# Patient Record
Sex: Female | Born: 1975
Health system: Southern US, Community
[De-identification: ages and names within clinical notes are randomized; demographics above are authoritative.]

## PROBLEM LIST (undated history)

## (undated) ENCOUNTER — Inpatient Hospital Stay (HOSPITAL_COMMUNITY): Payer: Self-pay

## (undated) ENCOUNTER — Emergency Department (HOSPITAL_COMMUNITY): Admission: EM | Payer: Medicaid Other | Source: Home / Self Care

## (undated) DIAGNOSIS — F32A Depression, unspecified: Secondary | ICD-10-CM

## (undated) DIAGNOSIS — R87619 Unspecified abnormal cytological findings in specimens from cervix uteri: Secondary | ICD-10-CM

## (undated) DIAGNOSIS — E785 Hyperlipidemia, unspecified: Secondary | ICD-10-CM

## (undated) DIAGNOSIS — O09529 Supervision of elderly multigravida, unspecified trimester: Secondary | ICD-10-CM

## (undated) DIAGNOSIS — Z8719 Personal history of other diseases of the digestive system: Secondary | ICD-10-CM

## (undated) DIAGNOSIS — N390 Urinary tract infection, site not specified: Secondary | ICD-10-CM

## (undated) DIAGNOSIS — T4145XA Adverse effect of unspecified anesthetic, initial encounter: Secondary | ICD-10-CM

## (undated) DIAGNOSIS — R51 Headache: Secondary | ICD-10-CM

## (undated) DIAGNOSIS — F329 Major depressive disorder, single episode, unspecified: Secondary | ICD-10-CM

## (undated) DIAGNOSIS — T8859XA Other complications of anesthesia, initial encounter: Secondary | ICD-10-CM

## (undated) DIAGNOSIS — F419 Anxiety disorder, unspecified: Secondary | ICD-10-CM

## (undated) DIAGNOSIS — IMO0002 Reserved for concepts with insufficient information to code with codable children: Secondary | ICD-10-CM

## (undated) HISTORY — DX: Headache: R51

## (undated) HISTORY — PX: TONSILLECTOMY: SUR1361

## (undated) HISTORY — DX: Hyperlipidemia, unspecified: E78.5

## (undated) HISTORY — PX: AUGMENTATION MAMMAPLASTY: SUR837

## (undated) HISTORY — PX: DILATION AND CURETTAGE OF UTERUS: SHX78

## (undated) HISTORY — DX: Personal history of other diseases of the digestive system: Z87.19

## (undated) HISTORY — PX: EYE SURGERY: SHX253

## (undated) HISTORY — DX: Supervision of elderly multigravida, unspecified trimester: O09.529

---

## 1998-03-28 ENCOUNTER — Encounter: Payer: Self-pay | Admitting: Emergency Medicine

## 1998-03-28 ENCOUNTER — Emergency Department (HOSPITAL_COMMUNITY): Admission: EM | Admit: 1998-03-28 | Discharge: 1998-03-28 | Payer: Self-pay | Admitting: Emergency Medicine

## 1998-11-07 ENCOUNTER — Emergency Department (HOSPITAL_COMMUNITY): Admission: EM | Admit: 1998-11-07 | Discharge: 1998-11-08 | Payer: Self-pay | Admitting: Emergency Medicine

## 2000-03-09 ENCOUNTER — Other Ambulatory Visit: Admission: RE | Admit: 2000-03-09 | Discharge: 2000-03-09 | Payer: Self-pay | Admitting: Obstetrics and Gynecology

## 2000-05-16 ENCOUNTER — Emergency Department (HOSPITAL_COMMUNITY): Admission: EM | Admit: 2000-05-16 | Discharge: 2000-05-16 | Payer: Self-pay | Admitting: Emergency Medicine

## 2001-03-16 ENCOUNTER — Other Ambulatory Visit: Admission: RE | Admit: 2001-03-16 | Discharge: 2001-03-16 | Payer: Self-pay | Admitting: Obstetrics and Gynecology

## 2001-09-20 ENCOUNTER — Encounter: Payer: Self-pay | Admitting: Surgery

## 2001-09-20 ENCOUNTER — Emergency Department (HOSPITAL_COMMUNITY): Admission: EM | Admit: 2001-09-20 | Discharge: 2001-09-20 | Payer: Self-pay | Admitting: Emergency Medicine

## 2001-10-19 ENCOUNTER — Ambulatory Visit (HOSPITAL_COMMUNITY): Admission: RE | Admit: 2001-10-19 | Discharge: 2001-10-19 | Payer: Self-pay | Admitting: Obstetrics and Gynecology

## 2001-10-19 ENCOUNTER — Encounter: Payer: Self-pay | Admitting: Obstetrics and Gynecology

## 2001-11-19 ENCOUNTER — Encounter: Payer: Self-pay | Admitting: Obstetrics and Gynecology

## 2001-11-19 ENCOUNTER — Ambulatory Visit (HOSPITAL_COMMUNITY): Admission: RE | Admit: 2001-11-19 | Discharge: 2001-11-19 | Payer: Self-pay | Admitting: Family Medicine

## 2001-11-27 ENCOUNTER — Encounter: Payer: Self-pay | Admitting: *Deleted

## 2001-11-27 ENCOUNTER — Ambulatory Visit (HOSPITAL_COMMUNITY): Admission: RE | Admit: 2001-11-27 | Discharge: 2001-11-27 | Payer: Self-pay | Admitting: *Deleted

## 2002-01-01 ENCOUNTER — Ambulatory Visit (HOSPITAL_COMMUNITY): Admission: RE | Admit: 2002-01-01 | Discharge: 2002-01-01 | Payer: Self-pay | Admitting: *Deleted

## 2002-01-01 ENCOUNTER — Encounter: Payer: Self-pay | Admitting: *Deleted

## 2002-01-15 ENCOUNTER — Inpatient Hospital Stay (HOSPITAL_COMMUNITY): Admission: AD | Admit: 2002-01-15 | Discharge: 2002-01-15 | Payer: Self-pay | Admitting: Obstetrics and Gynecology

## 2002-02-07 ENCOUNTER — Inpatient Hospital Stay (HOSPITAL_COMMUNITY): Admission: AD | Admit: 2002-02-07 | Discharge: 2002-02-07 | Payer: Self-pay | Admitting: Obstetrics and Gynecology

## 2002-02-12 ENCOUNTER — Encounter: Admission: AD | Admit: 2002-02-12 | Discharge: 2002-02-28 | Payer: Self-pay | Admitting: Obstetrics and Gynecology

## 2002-02-19 ENCOUNTER — Inpatient Hospital Stay (HOSPITAL_COMMUNITY): Admission: AD | Admit: 2002-02-19 | Discharge: 2002-02-19 | Payer: Self-pay | Admitting: Obstetrics and Gynecology

## 2002-02-23 ENCOUNTER — Inpatient Hospital Stay (HOSPITAL_COMMUNITY): Admission: AD | Admit: 2002-02-23 | Discharge: 2002-02-23 | Payer: Self-pay | Admitting: Obstetrics and Gynecology

## 2002-03-05 ENCOUNTER — Inpatient Hospital Stay (HOSPITAL_COMMUNITY): Admission: AD | Admit: 2002-03-05 | Discharge: 2002-03-07 | Payer: Self-pay | Admitting: Obstetrics and Gynecology

## 2002-03-08 ENCOUNTER — Encounter: Admission: RE | Admit: 2002-03-08 | Discharge: 2002-04-07 | Payer: Self-pay | Admitting: Obstetrics and Gynecology

## 2002-04-17 ENCOUNTER — Other Ambulatory Visit: Admission: RE | Admit: 2002-04-17 | Discharge: 2002-04-17 | Payer: Self-pay | Admitting: Obstetrics and Gynecology

## 2002-11-22 ENCOUNTER — Ambulatory Visit (HOSPITAL_BASED_OUTPATIENT_CLINIC_OR_DEPARTMENT_OTHER): Admission: RE | Admit: 2002-11-22 | Discharge: 2002-11-22 | Payer: Self-pay | Admitting: Otolaryngology

## 2002-11-22 ENCOUNTER — Encounter (INDEPENDENT_AMBULATORY_CARE_PROVIDER_SITE_OTHER): Payer: Self-pay | Admitting: *Deleted

## 2003-02-27 ENCOUNTER — Ambulatory Visit (HOSPITAL_BASED_OUTPATIENT_CLINIC_OR_DEPARTMENT_OTHER): Admission: RE | Admit: 2003-02-27 | Discharge: 2003-02-27 | Payer: Self-pay | Admitting: Otolaryngology

## 2003-02-27 ENCOUNTER — Encounter (INDEPENDENT_AMBULATORY_CARE_PROVIDER_SITE_OTHER): Payer: Self-pay | Admitting: Specialist

## 2003-05-27 ENCOUNTER — Other Ambulatory Visit: Admission: RE | Admit: 2003-05-27 | Discharge: 2003-05-27 | Payer: Self-pay | Admitting: Obstetrics and Gynecology

## 2004-08-05 ENCOUNTER — Other Ambulatory Visit: Admission: RE | Admit: 2004-08-05 | Discharge: 2004-08-05 | Payer: Self-pay | Admitting: Obstetrics and Gynecology

## 2005-09-06 ENCOUNTER — Other Ambulatory Visit: Admission: RE | Admit: 2005-09-06 | Discharge: 2005-09-06 | Payer: Self-pay | Admitting: Obstetrics and Gynecology

## 2010-11-26 NOTE — H&P (Signed)
[facility]  Patient:    Kelly Clay, Kelly Clay Visit Number: [id number] MRN: [id number]          Service Type: EMS Location: ED Attending Physician:  Donnetta Hutching Dictated by:   Currie Paris, M.D. Admit Date:  09/20/2001                           History and Physical  VISIT #[id number]  CHIEF COMPLAINT:  Abdominal pain.  HISTORY OF PRESENT ILLNESS:  Ms. Millis is a 35 year old woman who is [redacted] weeks pregnant who has developed right lower quadrant pain.  She has actually had some diarrhea for several days and then last evening developed some more significant abdominal pain which was not localized right to left but today has now localized to the right lower quadrant.  She is not nauseated but is not hungry.  She points to the area just above [place] as the area where she is hurting the most.  She never had any pain similar to this.  She has had no problems with her pregnancy.  It has gone smoothly.  This is her second pregnancy.  She has had no urinary tract symptoms and no other general illnesses.  PAST MEDICAL HISTORY:  No operations.  MEDICATIONS:  Prenatal vitamins.  ALLERGIES:  None.  SOCIAL HISTORY:  Smokes, none.  Alcohol, none.  FAMILY HISTORY:  Unremarkable.  PHYSICAL EXAMINATION:  GENERAL:  Slightly overweight, healthy-appearing female who appears uncomplicated.  HEENT:  Head is normocephalic.  Eyes nonicteric.  Pupils round and regular.  NECK:  Supple.  No masses or thyromegaly.  LUNGS:  Clear to auscultation.  HEART:  Regular.  No murmurs, rubs, or gallops.  ABDOMEN:  Generally soft, but she has tenderness in the right lower quadrant. This is fairly marked with some referred rebound to the right lower quadrant. Bowel sounds are hypoactive.  I do not feel any masses.  EXTREMITIES:  No cyanosis or edema.  LABORATORY DATA:  Pending.  IMPRESSION:  Clinically likely to have appendicitis.  PLAN:  While awaiting the  laboratories, I think we will try to get an ultrasound and see if that helps [place] differentiate her diagnosis.  Once that is done, can make further decisions about surgical plans, admission, etc. Dictated by:   Currie Paris, M.D. Attending Physician:  Donnetta Hutching DD:  09/20/01 TD:  09/21/01 Job: [id number]

## 2010-11-26 NOTE — Op Note (Signed)
NAME:  Kelly Clay, Kelly Clay                           ACCOUNT NO.:  [id number]   MEDICAL RECORD NO.:  [id number]                   PATIENT TYPE:  AMB   LOCATION:  DSC                                  FACILITY:  [facility]   PHYSICIAN:  Hermelinda Medicus, M.D.                DATE OF BIRTH:  1976/05/03   DATE OF PROCEDURE:  11/22/2002  DATE OF DISCHARGE:                                 OPERATIVE REPORT   PREOPERATIVE DIAGNOSIS:  Septal deviation.  History of sinusitis.  History  of nasal obstruction.  History of poor response to decongestants, nasal  sprays, and persistent use of antibiotics.   POSTOPERATIVE DIAGNOSIS:  Septal deviation.  History of sinusitis.  History  of nasal obstruction.  History of poor response to decongestants, nasal  sprays, and persistent use of antibiotics.   OPERATION:  Septal reconstruction and turbinate reduction.   SURGEON:  Hermelinda Medicus, M.D.   ANESTHESIA:  General endotracheal with local supplement 1% Xylocaine with  epinephrine and topical cocaine 200 mg.   ANESTHESIOLOGIST:  Janetta Hora. Gelene Mink, M.D.   PROCEDURE:  The patient was placed in the supine position under general  endotracheal anesthesia.  The patient was prepped and draped __________ and  Hibiclens times three in the usual head drape.  Xylocaine 1% with  epinephrine and topical cocaine 200 mg was used for further anesthesia and  hemostasis after the patient was completely draped.  Once this was  completed, then the inferior turbinates were out-fractured in an effort to  gain more space in this very small, narrow nose.  The middle turbinate on  the right was somewhat large and this was decreased in size.  It was  pressing right up against the right ethmoid sinus.  The left side was  impossible to see because of the septal deviation, pushing over against the  turbinate as well seen on the CT scan.  A hemitransfixion incision was made  on the left side, carried back along the quadrilateral  cartilage posteriorly  and a strip of quadrilateral cartilage was taken from the posterior aspect.  The floor of the nose was also approached where the spur was vomerine and  was off the premaxillary crest and a strip was taken from this portion more  posteriorly.  The open-and-close Jansen-Middletons and Takahashi forceps  were used to remove an area of ethmoid septal deviation and also a portion  of vomerine septal deviation was used with the assistance of the 4-mm  chisel.  Once the spur and vomerine septal spur were removed, the septum was  established in the midline and the nasal airway was markedly improved.  The  middle turbinates were then brought medial so that the ethmoid sinuses could  drain more effectively.  The closure was with 5-0 plain catgut and a through-and-through septal  suture was used with 4-0 Prolene times two.  Intranasal dressing was placed  which  will be removed this afternoon and then followup will be in one week,  three weeks, and six weeks.                                               Hermelinda Medicus, M.D.    JC/MEDQ  D:  11/22/2002  T:  11/23/2002  Job:  [id number]   cc:   Ike Bene, M.D.  [address]  [place]  [place] [address]  Fax: [phone]

## 2010-11-26 NOTE — Discharge Summary (Signed)
NAME:  Kelly Clay, Kelly Clay                           ACCOUNT NO.:  [id number]   MEDICAL RECORD NO.:  [id number]                   PATIENT TYPE:  INP   LOCATION:  9117                                 FACILITY:  [facility]   PHYSICIAN:  Huel Cote, M.D.              DATE OF BIRTH:  03-16-1976   DATE OF ADMISSION:  03/05/2002  DATE OF DISCHARGE:  03/07/2002                                 DISCHARGE SUMMARY   DISCHARGE DIAGNOSES:  1. Term pregnancy at 2 and five-sevenths weeks delivered.  2. Preeclampsia.  3. Status post normal spontaneous vaginal delivery.   DISCHARGE MEDICATIONS:  1. Motrin 600 mg p.o. q.6h.  2. Percocet one to two tablets p.o. q.4h. p.r.n.   DISCHARGE FOLLOW-UP:  The patient is to follow up in our office in  approximately six weeks for her postpartum exam.   HISTORY OF PRESENT ILLNESS:  The patient is a 35 year old G3 P1-0-1-1 who  was admitted at 34 and five-sevenths weeks of weeks for induction of labor given  persistent mild preeclampsia.  The patient's blood pressure had been noted  to be elevated since approximately 33 weeks, running in the 150s to 90s  range and she had been being followed with NSTs and preeclamptic labs.  The  patient's blood pressure on the day of admission had increased to 150/110  and she had a new development of 1-2+ proteinuria.  Otherwise, the patient  had no significant symptoms.  There were some occasional mild headaches and  edema.  Pregnancy had been complicated by bilateral club feet noted in the  fetus with no other anomalies.   PRENATAL LABORATORY DATA:  A positive, antibody negative.  RPR nonreactive.  Rubella immune.  Hepatitis B surface antigen negative.  HIV negative.  GC  negative, chlamydia negative.  GBS negative.   PAST OBSTETRICAL HISTORY:  In 1994 she had an elective abortion.  In 1995  she had a normal spontaneous vaginal delivery of a 6 pound 13 ounce infant.   PAST GYNECOLOGICAL HISTORY:  No abnormal Pap smears.   PAST  MEDICAL HISTORY:  History of a hiatal hernia and migraine headaches.   PAST SURGICAL HISTORY:  None.   HOSPITAL COURSE:  On admission, the patient was afebrile.  Her blood  pressures were well controlled on bedrest, running in the 130s to 150s over  80s to very low 90s.  The NST was reactive.  Her cervix was 50, 2-3 cm, and  a -2 station.  She had assisted rupture of membranes with clear fluid  obtained and was begun on low-dose Pitocin.  Preeclamptic labs were obtained  and were within normal limits.  Given the patient's preeclamptic status was  mild with normal labs and blood pressures well controlled, it was felt that  she was stable enough to withhold magnesium.  The patient quickly reached  complete dilation within hours and pushed well, with a normal spontaneous  vaginal delivery of a viable female infant from LOP presentation.  Apgars were  6, 7, and 9.  Weight was 5 pounds 1 ounce.  There was a nuchal cord x1  reduced over the head.  Placenta delivered spontaneously.  Pediatrics  evaluated the baby for rapid breathing; however, the baby responded well in  the room to blow-by oxygen, did not require any further intervention.  The  bilateral club feet were noted at birth as were noted antenatally.  A small  first degree laceration was repaired with one interrupted suture for  hemostasis of 2-0 Vicryl.  Estimated blood loss was 350 cc.  Cervix, rectum,  and perineum were all intact.  On postpartum day #2 the patient was feeling  very well.  She had no complaints.  Her blood pressure had been stable  postpartum, with the highest being 130/90.  Fundus was firm.  Therefore she  was felt stable for discharge home and was discharged with medications, and  follow-up in six weeks in our office.                                               Huel Cote, M.D.    KR/MEDQ  D:  03/07/2002  T:  03/07/2002  Job:  [id number]

## 2010-11-26 NOTE — H&P (Signed)
NAME:  Kelly Clay, Kelly Clay                           ACCOUNT NO.:  [id number]   MEDICAL RECORD NO.:  [id number]                   PATIENT TYPE:  AMB   LOCATION:  DSC                                  FACILITY:  [facility]   PHYSICIAN:  Hermelinda Medicus, M.D.                DATE OF BIRTH:  1975-11-08   DATE OF ADMISSION:  11/22/2002  DATE OF DISCHARGE:                                HISTORY & PHYSICAL   HISTORY OF PRESENT ILLNESS:  This patient is a 35 year old female who has  had considerable sinus problems in the past.  She entered my office with a  history of being treated with a Z-pack and decongestants.  She had a second  sinus infection with purulent drainage on the back of her throat,  erythematous nose treated with Augmentin 1000 mg , Prolex D, and Allegra 60  for her intermittent allergies.  She did not respond that well to this, she  still had erythema and drainage.  We increased her to Tequin 400 mg daily.  We also obtained a CAT scan which revealed that her sinuses were clearing,  however, she had a very small, very tight nose with a severe septal  deviation to the left that was abutting into the side of the middle  turbinate and she had always had difficulty breathing through her nose.  The  nasal airway was poor, her septum has been deviated - the first time I saw  her, she had so much erythema that I could not really see this as well as I  did the second time.   PAST MEDICAL HISTORY:  Prolex D, Allegra D, Yasmin, and nasal corticosteroid  sprays.  She has a hiatal hernia and is treated for that but is not using  any medication at this time.  Her further past history is no history of any  surgery.  The only other problem she has had apparently, the only other  surgery is she had a skin cancer removed from her right shoulder.   ALLERGIES:  Her only allergy is IMITREX.   PHYSICAL EXAMINATION:  VITAL SIGNS:  Blood pressure 121/78, heart rate 82,  she weighs 211 pounds, she is 5 feet  4 inches.  HEENT:  Her ears are clear.  The tympanic membranes are clear.  The oral  cavity is clear.  Nose shows the septal deviation to the left with  resolution of this purulent drainage.  The larynx is clear _______.  Base of  the tongue is clear of any ulceration or mass.  True cord mobility, gag  reflex, tongue mobility, EOMs, facial nerves are all symmetrical.  NECK:  Free of any thyromegaly, cervical adenopathy or mass.  CHEST:  Clear, no rales, rhonchi or wheezes.  CARDIOVASCULAR:  No murmurs, rubs or gallops.  ABDOMEN:  Free of any organomegaly, tenderness or mass.  She is mildly  obese.  EXTREMITIES:  Unremarkable.   INITIAL DIAGNOSES:  1. Septal deviation.  2. History of persistent sinusitis.  3.     Turbinate hypertrophy with history of allergic rhinitis.  4. History of skin cancer from right shoulder.  5. History of hiatal hernia.                                               Hermelinda Medicus, M.D.    JC/MEDQ  D:  11/22/2002  T:  11/23/2002  Job:  [id number]   cc:   Dr. [facility]

## 2010-11-26 NOTE — H&P (Signed)
NAME:  Kelly Clay, Kelly Clay                           ACCOUNT NO.:  [id number]   MEDICAL RECORD NO.:  [id number]                   PATIENT TYPE:  AMB   LOCATION:  DSC                                  FACILITY:  [facility]   PHYSICIAN:  Hermelinda Medicus, M.D.                DATE OF BIRTH:  08/08/1975   DATE OF ADMISSION:  02/27/2003  DATE OF DISCHARGE:                                HISTORY & PHYSICAL   REASON FOR ADMISSION:  This patient is a 35 year old female who has had  septal surgery in the past under my care for sinusitis and nasal congestion.  She did much better; however, she also had associated tonsillitis where she  had been on antibiotics in the past for this, primarily Augmentin XR, and  then she has also been on Biaxin.  She has had multiple ear infections  several years back which continued through 2004.  She had been on not only  the above mentioned medication, but she had also recently on Tequin.  She  had also had a considerable number of tonsillitis problems when she was a  child and these problems have resurfaced now that she has a child in  daycare.  She now enters having had several rounds of antibiotics for this  problem including Tequin on several occasions for a tonsillectomy.  We  talked about the risks and gains. We talked about the sore throat, the lack  of being able to travel and her diet and she now enters for this surgery.   ALLERGIES:  IMITREX allergy or intolerance.   MEDICATIONS:  1. Birth control pills.  2. Tequin.  3. Hygroton 25 mg per day.  4. Potassium supplement, Micro-K 10 mEq daily.   PAST MEDICAL HISTORY:  1. Deviated septum surgery in May 2004.  2. Bacterial infections that were quite severe in the distant past from her     tonsils.  3. Colonoscopy in June 2004.  4. Hiatal hernia in the past.  5. She wears contacts and she has braces.   PHYSICAL EXAMINATION:  VITAL SIGNS: Blood pressure is 131/88, weight is 202,  height is 5'4, heart rate  89.  HEENT:  The ears are clear.  The tympanic membranes look excellent. The nose  looks excellent with nasal airway excellent.  Her tonsils are exudative that  are quite large, 3+ in size.  Her neck is free of any thyromegaly, cervical  adenopathy or mass.  CHEST:  Clear with no wheezes, rales or rhonchi.  CARDIOVASCULAR:  No murmurs, rubs or gallops.  ABDOMEN:  Free of any organomegaly, tenderness or mass.   INITIAL DIAGNOSIS:  Tonsillitis with a history of septal deviation and  sinusitis and history of otitis media.  Hermelinda Medicus, M.D.    JC/MEDQ  D:  02/27/2003  T:  02/27/2003  Job:  [id number]   cc:   Gaspar Garbe, M.D.  [address]  [place]  [place] [address]  Fax: [phone]

## 2010-11-26 NOTE — Op Note (Signed)
   NAME:  Kelly Clay, Kelly Clay                           ACCOUNT NO.:  [id number]   MEDICAL RECORD NO.:  [id number]                   PATIENT TYPE:  AMB   LOCATION:  DSC                                  FACILITY:  [facility]   PHYSICIAN:  Hermelinda Medicus, M.D.                DATE OF BIRTH:  Sep 28, 1975   DATE OF PROCEDURE:  02/27/2003  DATE OF DISCHARGE:                                 OPERATIVE REPORT   PREOPERATIVE DIAGNOSIS:  Tonsillitis.   POSTOPERATIVE DIAGNOSIS:  Tonsillitis.   OPERATION PERFORMED:  Tonsillectomy.   SURGEON:  Hermelinda Medicus, M.D.   ANESTHESIA:  General endotracheal.   ANESTHESIOLOGIST:  Sheldon Silvan, M.D.   DESCRIPTION OF PROCEDURE:  The patient was placed in supine position.  Under  general endotracheal anesthesia, the tonsils were removed using Bovie  electrocoagulation, blunt dissection and all hemostasis established with  Bovie electrocoagulation.  The tonsils were removed without difficulty.  There was considerable exudate throughout the center of the tonsils.  Once  we removed these, the tonsillar bed was carefully checked and found to be in  good condition and the tonsillar bed was completely dry.  The stomach was  suctioned.  The nasopharynx was suctioned and the patient tolerated the  procedure well and was doing well postoperatively.  Follow-up will be in  five days and in two weeks, four weeks and six weeks.                                                Hermelinda Medicus, M.D.    JC/MEDQ  D:  02/27/2003  T:  02/27/2003  Job:  [id number]   cc:   Gaspar Garbe, M.D.  [address]  [place]  [place] [address]  Fax: [phone]

## 2011-12-01 ENCOUNTER — Other Ambulatory Visit (HOSPITAL_COMMUNITY): Payer: Self-pay | Admitting: Obstetrics and Gynecology

## 2011-12-01 DIAGNOSIS — Z331 Pregnant state, incidental: Secondary | ICD-10-CM

## 2011-12-02 ENCOUNTER — Ambulatory Visit (HOSPITAL_COMMUNITY)
Admission: RE | Admit: 2011-12-02 | Discharge: 2011-12-02 | Disposition: A | Discharge status: Home / Self Care | Payer: Medicaid Other | Source: Ambulatory Visit | Attending: Obstetrics and Gynecology | Admitting: Obstetrics and Gynecology

## 2011-12-02 DIAGNOSIS — Z331 Pregnant state, incidental: Secondary | ICD-10-CM

## 2011-12-02 DIAGNOSIS — Z3689 Encounter for other specified antenatal screening: Secondary | ICD-10-CM | POA: Insufficient documentation

## 2011-12-29 LAB — OB RESULTS CONSOLE HEPATITIS B SURFACE ANTIGEN: Hepatitis B Surface Ag: NEGATIVE

## 2011-12-29 LAB — OB RESULTS CONSOLE GC/CHLAMYDIA
Chlamydia: NEGATIVE
Gonorrhea: NEGATIVE

## 2011-12-29 LAB — OB RESULTS CONSOLE RPR: RPR: NONREACTIVE

## 2012-02-11 ENCOUNTER — Inpatient Hospital Stay (HOSPITAL_COMMUNITY)
Admission: AD | Admit: 2012-02-11 | Discharge: 2012-02-11 | Disposition: A | Discharge status: Home / Self Care | Payer: Medicaid Other | Source: Ambulatory Visit | Attending: Obstetrics and Gynecology | Admitting: Obstetrics and Gynecology

## 2012-02-11 ENCOUNTER — Encounter (HOSPITAL_COMMUNITY): Payer: Self-pay | Admitting: Obstetrics and Gynecology

## 2012-02-11 DIAGNOSIS — O99891 Other specified diseases and conditions complicating pregnancy: Secondary | ICD-10-CM | POA: Insufficient documentation

## 2012-02-11 DIAGNOSIS — R109 Unspecified abdominal pain: Secondary | ICD-10-CM | POA: Insufficient documentation

## 2012-02-11 DIAGNOSIS — O26899 Other specified pregnancy related conditions, unspecified trimester: Secondary | ICD-10-CM

## 2012-02-11 HISTORY — DX: Anxiety disorder, unspecified: F41.9

## 2012-02-11 HISTORY — DX: Major depressive disorder, single episode, unspecified: F32.9

## 2012-02-11 HISTORY — DX: Depression, unspecified: F32.A

## 2012-02-11 LAB — URINALYSIS, ROUTINE W REFLEX MICROSCOPIC
Bilirubin Urine: NEGATIVE
Glucose, UA: NEGATIVE mg/dL
Hgb urine dipstick: NEGATIVE
Ketones, ur: 15 mg/dL — AB
Leukocytes, UA: NEGATIVE
Protein, ur: NEGATIVE mg/dL

## 2012-02-11 NOTE — MAU Note (Signed)
Patient is [redacted] weeks gestation, last night about 11:00 p.m. Having contractions every 10 minutes which lasted until 3:00 a.m.having some sharp pain and pressure now every 10-15 minutes, denies vaginal bleeding

## 2012-02-11 NOTE — MAU Note (Signed)
Pt presents to MAU with chief complaint of abdominal cramping and contractions. Pt is [redacted]w[redacted]d- pt of dr. Ellyn Hack, says the contractions started one worse and got worse last night following intercourse. Pt denies vaginal bleeding, does say she has had increased mucous. Pt is G5P2

## 2012-02-11 NOTE — MAU Provider Note (Signed)
History     CSN: [id number]  Arrival date and time: 02/11/12 1317   First Provider Initiated Contact with Patient 02/11/12 1456      Chief Complaint  Patient presents with  . Abdominal Cramping   HPI Kelly Clay is 36 y.o. [id number] [redacted]w[redacted]d weeks presenting with having contractions since last night--68minutes apart from 10pm-3am today.  Tried changing positions and drinking water without change in fact the contractions got stronger at 2am.  Last intercourse at 8:30pm.  Denies leaking of fluid or vaginal bleeding.  Dx of preterm delivery at 36.5 weeks, hypertensive.  Bleeding after sex several weeks ago but none since.  Denies complications with this pregnancy.    Past Medical History  Diagnosis Date  . Depression   . Preterm labor   . Anxiety     Past Surgical History  Procedure Date  . Tonsillectomy     History reviewed. No pertinent family history.  History  Substance Use Topics  . Smoking status: Never Smoker   . Smokeless tobacco: Not on file  . Alcohol Use: No    Allergies:  Allergies  Allergen Reactions  . Imitrex (Sumatriptan) Anaphylaxis, Swelling and Other (See Comments)    In throat. Numbness/tigling in body    Prescriptions prior to admission  Medication Sig Dispense Refill  . acetaminophen (TYLENOL) 325 MG tablet Take 650 mg by mouth every 8 (eight) hours as needed. For pain      . diphenhydramine-acetaminophen (TYLENOL PM) 25-500 MG TABS Take 1 tablet by mouth at bedtime as needed. For pain and sleep      . pantoprazole (PROTONIX) 20 MG tablet Take 20 mg by mouth daily.      . promethazine (PHENERGAN) 25 MG tablet Take 25 mg by mouth every 6 (six) hours as needed. For nausea      . sertraline (ZOLOFT) 50 MG tablet Take 50 mg by mouth daily.        Review of Systems  Constitutional: Negative.   Respiratory: Negative.   Cardiovascular: Negative.   Gastrointestinal: Positive for nausea and abdominal pain (contractions). Negative for vomiting.    Genitourinary:       Neg for vaginal bleeding    Physical Exam   Blood pressure 115/63, pulse 90, temperature 98.3 F (36.8 C), resp. rate 16, height 5\' 4"  (1.626 m), weight 96.798 kg (213 lb 6.4 oz).  Physical Exam  Constitutional: She is oriented to person, place, and time. She appears well-developed and well-nourished. No distress.  HENT:  Head: Normocephalic.  Neck: Normal range of motion.  Cardiovascular: Normal rate.   Respiratory: Effort normal.  GI: Soft. There is no tenderness.  Genitourinary: Uterus is enlarged. Uterus is not tender. Cervix exhibits no motion tenderness, no discharge and no friability. No bleeding around the vagina. Vaginal discharge (small amount of white thin discharge without odor) found.       Cervix is closed.  Neurological: She is alert and oriented to person, place, and time.  Skin: Skin is warm and dry.  Psychiatric: She has a normal mood and affect. Her behavior is normal.   Results for orders placed during the hospital encounter of 02/11/12 (from the past 24 hour(s))  URINALYSIS, ROUTINE W REFLEX MICROSCOPIC     Status: Abnormal   Collection Time   02/11/12  1:30 PM      Component Value Range   Color, Urine YELLOW  YELLOW   APPearance CLEAR  CLEAR   Specific Gravity, Urine 1.025  1.005 -  1.030   pH 6.0  5.0 - 8.0   Glucose, UA NEGATIVE  NEGATIVE mg/dL   Hgb urine dipstick NEGATIVE  NEGATIVE   Bilirubin Urine NEGATIVE  NEGATIVE   Ketones, ur 15 (*) NEGATIVE mg/dL   Protein, ur NEGATIVE  NEGATIVE mg/dL   Urobilinogen, UA 0.2  0.0 - 1.0 mg/dL   Nitrite NEGATIVE  NEGATIVE   Leukocytes, UA NEGATIVE  NEGATIVE    MAU Course  Procedures  MDM 15:15  Reported MSE and physical exam and UA results to Dr. Ellyn Hack.   Discharge to home on pelvic rest.  Report worsening sxs and keep scheduled appointment Assessment and Plan  A:  Abdominal pain at [redacted]w[redacted]d gestation     P:  Instructed to have pelvic rest     Call MD if sxs worsen     Keep scheduled  appointment     Stay well hydrated  KEY,EVE M 02/11/2012, 2:56 PM

## 2012-03-25 ENCOUNTER — Encounter (HOSPITAL_COMMUNITY): Payer: Self-pay | Admitting: *Deleted

## 2012-03-25 ENCOUNTER — Inpatient Hospital Stay (HOSPITAL_COMMUNITY)
Admission: AD | Admit: 2012-03-25 | Discharge: 2012-03-25 | Disposition: A | Discharge status: Home / Self Care | Payer: Medicaid Other | Source: Ambulatory Visit | Attending: Obstetrics and Gynecology | Admitting: Obstetrics and Gynecology

## 2012-03-25 DIAGNOSIS — M545 Low back pain, unspecified: Secondary | ICD-10-CM | POA: Insufficient documentation

## 2012-03-25 DIAGNOSIS — N390 Urinary tract infection, site not specified: Secondary | ICD-10-CM

## 2012-03-25 DIAGNOSIS — O2390 Unspecified genitourinary tract infection in pregnancy, unspecified trimester: Secondary | ICD-10-CM | POA: Insufficient documentation

## 2012-03-25 DIAGNOSIS — R109 Unspecified abdominal pain: Secondary | ICD-10-CM | POA: Insufficient documentation

## 2012-03-25 DIAGNOSIS — O2340 Unspecified infection of urinary tract in pregnancy, unspecified trimester: Secondary | ICD-10-CM

## 2012-03-25 HISTORY — DX: Other complications of anesthesia, initial encounter: T88.59XA

## 2012-03-25 HISTORY — DX: Urinary tract infection, site not specified: N39.0

## 2012-03-25 HISTORY — DX: Unspecified abnormal cytological findings in specimens from cervix uteri: R87.619

## 2012-03-25 HISTORY — DX: Reserved for concepts with insufficient information to code with codable children: IMO0002

## 2012-03-25 HISTORY — DX: Adverse effect of unspecified anesthetic, initial encounter: T41.45XA

## 2012-03-25 LAB — URINALYSIS, ROUTINE W REFLEX MICROSCOPIC
Glucose, UA: NEGATIVE mg/dL
Nitrite: NEGATIVE
pH: 6 (ref 5.0–8.0)

## 2012-03-25 LAB — URINE MICROSCOPIC-ADD ON

## 2012-03-25 MED ORDER — NITROFURANTOIN MONOHYD MACRO 100 MG PO CAPS: 100.0000 mg | ORAL_CAPSULE | Freq: Two times a day (BID) | ORAL | Status: AC

## 2012-03-25 NOTE — MAU Note (Signed)
Cramping, yesterday had difficulty emptying bladder.  Some burning at end of void. Pink d/c noted when wiped yesterday.  Has had previously, denies any placental issues.  Has taken tylenol for cramping- no real relief noted.

## 2012-03-25 NOTE — MAU Note (Signed)
Pt reports not feeling well yesterday. Noticed a little pink spotting when she wiped. Felt like she ws not emptying her bladder. Reports she is started to feel some braxton hicks contractions. Thinks she may have a bladder infection.

## 2012-03-25 NOTE — MAU Provider Note (Signed)
History     CSN: [id number]  Arrival date and time: 03/25/12 1725   First Provider Initiated Contact with Patient 03/25/12 1901      Chief Complaint  Patient presents with  . Abdominal Cramping   HPI Kelly Clay 36 y.o. 104w4d Comes to MAU with low abdominal cramping, low back pain, and dysuria today.  Feels like she is not emptying her bladder since yesterday.  Has pain at the end of voiding.  Has urgency and then voids only a small amount.  Thinks she has a UTI.  Was feeling Kelly Clay earlier today, but they have stopped since resting in bed.  OB History    Grav Para Term Preterm Abortions TAB SAB Ect Mult Living   5 2 1 1 2  0 2 0 0 2      Past Medical History  Diagnosis Date  . Depression   . Anxiety   . Complication of anesthesia     epidurals did not take  . Pregnancy induced hypertension   . Preterm labor     induced at 36wks, PIH  . Urinary tract infection   . Abnormal Pap smear     colpo    Past Surgical History  Procedure Date  . Tonsillectomy     Family History  Problem Relation Age of Onset  . Other Neg Hx     History  Substance Use Topics  . Smoking status: Never Smoker   . Smokeless tobacco: Never Used  . Alcohol Use: No     prior to preg    Allergies:  Allergies  Allergen Reactions  . Imitrex (Sumatriptan) Anaphylaxis, Swelling and Other (See Comments)    In throat. Numbness/tigling in body    Prescriptions prior to admission  Medication Sig Dispense Refill  . acetaminophen (TYLENOL) 325 MG tablet Take 650 mg by mouth every 8 (eight) hours as needed. For pain      . diphenhydrAMINE (BENADRYL) 25 mg capsule Take 25 mg by mouth every 6 (six) hours as needed. For allergies      . pantoprazole (PROTONIX) 20 MG tablet Take 20 mg by mouth daily.      . Prenatal Vit-Fe Fumarate-FA (PRENATAL MULTIVITAMIN) TABS Take 1 tablet by mouth daily.      . promethazine (PHENERGAN) 25 MG tablet Take 25 mg by mouth every 6 (six) hours as needed. For  nausea      . sertraline (ZOLOFT) 100 MG tablet Take 100 mg by mouth daily.        Review of Systems  Constitutional: Negative for fever.  Gastrointestinal: Positive for abdominal pain. Negative for nausea, vomiting, diarrhea and constipation.  Genitourinary: Positive for dysuria and urgency.       No leaking or bleeding Voiding small amounts Does not feel she is emptying her bladder  Musculoskeletal: Positive for back pain.  Neurological: Negative for headaches.   Physical Exam   Blood pressure 125/69, pulse 95, temperature 98.7 F (37.1 C), temperature source Oral, resp. rate 18, height 5\' 4"  (1.626 m), weight 103.148 kg (227 lb 6.4 oz).  Physical Exam  Nursing note and vitals reviewed. Constitutional: She is oriented to person, place, and time. She appears well-developed and well-nourished.  HENT:  Head: Normocephalic.  Eyes: EOM are normal.  Neck: Neck supple.  GI: Soft. There is tenderness. There is no rebound and no guarding.       No contractions palpated or seen on monitor strip. FHT 155 with doppler  Genitourinary:  No CVA tenderness Tender in low right side at the level of sacrum Cervix closed.  Musculoskeletal: Normal range of motion.  Neurological: She is alert and oriented to person, place, and time.  Skin: Skin is warm and dry.  Psychiatric: She has a normal mood and affect.    MAU Course  Procedures Results for orders placed during the hospital encounter of 03/25/12 (from the past 24 hour(s))  URINALYSIS, ROUTINE W REFLEX MICROSCOPIC     Status: Abnormal   Collection Time   03/25/12  5:51 PM      Component Value Range   Color, Urine YELLOW  YELLOW   APPearance CLEAR  CLEAR   Specific Gravity, Urine 1.010  1.005 - 1.030   pH 6.0  5.0 - 8.0   Glucose, UA NEGATIVE  NEGATIVE mg/dL   Hgb urine dipstick NEGATIVE  NEGATIVE   Bilirubin Urine NEGATIVE  NEGATIVE   Ketones, ur NEGATIVE  NEGATIVE mg/dL   Protein, ur NEGATIVE  NEGATIVE mg/dL    Urobilinogen, UA 0.2  0.0 - 1.0 mg/dL   Nitrite NEGATIVE  NEGATIVE   Leukocytes, UA SMALL (*) NEGATIVE  URINE MICROSCOPIC-ADD ON     Status: Abnormal   Collection Time   03/25/12  5:51 PM      Component Value Range   Squamous Epithelial / LPF FEW (*) RARE   WBC, UA 3-6  <3 WBC/hpf   RBC / HPF 0-2  <3 RBC/hpf   Bacteria, UA RARE  RARE   Urine culture pending  MDM 1905  Consult with Dr. Ambrose Mantle re: plan of care  Assessment and Plan  UTI in pregnancy  Plan rx Macrobid 100 mg PO bid x 7 days (#14) no refills Return if you develop worsening symptoms, fever, back pain or vomiting. Drink at least 8 8-oz glasses of water every day. Take Tylenol 325 mg 2 tablets by mouth every 4 hours if needed for pain. Keep your appointments in the office.  Sargon Scouten 03/25/2012, 7:03 PM

## 2012-03-27 LAB — URINE CULTURE: Colony Count: NO GROWTH

## 2012-04-18 ENCOUNTER — Other Ambulatory Visit (HOSPITAL_COMMUNITY): Payer: Self-pay | Admitting: Obstetrics and Gynecology

## 2012-04-18 DIAGNOSIS — K8020 Calculus of gallbladder without cholecystitis without obstruction: Secondary | ICD-10-CM

## 2012-04-19 ENCOUNTER — Ambulatory Visit (HOSPITAL_COMMUNITY)
Admission: RE | Admit: 2012-04-19 | Discharge: 2012-04-19 | Disposition: A | Discharge status: Home / Self Care | Payer: Medicaid Other | Source: Ambulatory Visit | Attending: Obstetrics and Gynecology | Admitting: Obstetrics and Gynecology

## 2012-04-19 DIAGNOSIS — K7689 Other specified diseases of liver: Secondary | ICD-10-CM | POA: Insufficient documentation

## 2012-04-19 DIAGNOSIS — K8020 Calculus of gallbladder without cholecystitis without obstruction: Secondary | ICD-10-CM

## 2012-04-19 DIAGNOSIS — O99891 Other specified diseases and conditions complicating pregnancy: Secondary | ICD-10-CM | POA: Insufficient documentation

## 2012-04-19 DIAGNOSIS — R1011 Right upper quadrant pain: Secondary | ICD-10-CM | POA: Insufficient documentation

## 2012-05-01 ENCOUNTER — Encounter: Payer: Self-pay | Admitting: Gastroenterology

## 2012-05-08 ENCOUNTER — Ambulatory Visit: Payer: Medicaid Other | Admitting: Gastroenterology

## 2012-05-12 ENCOUNTER — Inpatient Hospital Stay (HOSPITAL_COMMUNITY)
Admission: AD | Admit: 2012-05-12 | Discharge: 2012-05-12 | Disposition: A | Discharge status: Home / Self Care | Payer: Medicaid Other | Source: Ambulatory Visit | Attending: Obstetrics and Gynecology | Admitting: Obstetrics and Gynecology

## 2012-05-12 ENCOUNTER — Encounter (HOSPITAL_COMMUNITY): Payer: Self-pay

## 2012-05-12 DIAGNOSIS — B9689 Other specified bacterial agents as the cause of diseases classified elsewhere: Secondary | ICD-10-CM

## 2012-05-12 DIAGNOSIS — O2390 Unspecified genitourinary tract infection in pregnancy, unspecified trimester: Secondary | ICD-10-CM | POA: Insufficient documentation

## 2012-05-12 DIAGNOSIS — A499 Bacterial infection, unspecified: Secondary | ICD-10-CM

## 2012-05-12 DIAGNOSIS — R109 Unspecified abdominal pain: Secondary | ICD-10-CM | POA: Insufficient documentation

## 2012-05-12 DIAGNOSIS — N760 Acute vaginitis: Secondary | ICD-10-CM

## 2012-05-12 DIAGNOSIS — N949 Unspecified condition associated with female genital organs and menstrual cycle: Secondary | ICD-10-CM | POA: Insufficient documentation

## 2012-05-12 LAB — URINE MICROSCOPIC-ADD ON

## 2012-05-12 LAB — URINALYSIS, ROUTINE W REFLEX MICROSCOPIC
Glucose, UA: NEGATIVE mg/dL
Ketones, ur: NEGATIVE mg/dL
Nitrite: NEGATIVE
Protein, ur: NEGATIVE mg/dL

## 2012-05-12 LAB — WET PREP, GENITAL: Yeast Wet Prep HPF POC: NONE SEEN

## 2012-05-12 MED ORDER — METRONIDAZOLE 500 MG PO TABS: 500.0000 mg | ORAL_TABLET | Freq: Two times a day (BID) | ORAL | Status: AC

## 2012-05-12 NOTE — MAU Note (Signed)
Pt states lower abd pain can be sharp, has had leaking of fluid today at multiple different times. Fluid clear, non-odorous. Intermittent contractions.

## 2012-05-12 NOTE — MAU Note (Signed)
Pt reports having leaking of fluid and sharp abd cramping. Has had several gushes of fluid. Good fetal movement reported

## 2012-05-12 NOTE — MAU Provider Note (Signed)
History     CSN: [id number]  Arrival date and time: 05/12/12 1841   None     Chief Complaint  Patient presents with  . Abdominal Cramping   HPI 36 y.o. [id number] at [redacted]w[redacted]d who reports leaking of fluid. Underwear wet this morning. Has felt several other small gushes of fluid throughout the day and underwear has been soaked. Also having suprapubic/vaginal pains - achy, constant - and a lot of pressure. Baby moving. No bleeding or contractions. No dysuria.   Hx preterm delivery after induction for preeclamspsia.  OB History    Grav Para Term Preterm Abortions TAB SAB Ect Mult Living   5 2 1 1 2  0 2 0 0 2      Past Medical History  Diagnosis Date  . Depression   . Anxiety   . Complication of anesthesia     epidurals did not take  . Pregnancy induced hypertension   . Preterm labor     induced at 36wks, PIH  . Urinary tract infection   . Abnormal Pap smear     colpo    Past Surgical History  Procedure Date  . Tonsillectomy     Family History  Problem Relation Age of Onset  . Other Neg Hx     History  Substance Use Topics  . Smoking status: Never Smoker   . Smokeless tobacco: Never Used  . Alcohol Use: No     prior to preg    Allergies:  Allergies  Allergen Reactions  . Imitrex (Sumatriptan) Anaphylaxis, Swelling and Other (See Comments)    In throat. Numbness/tigling in body    Prescriptions prior to admission  Medication Sig Dispense Refill  . acetaminophen (TYLENOL) 325 MG tablet Take 650 mg by mouth every 8 (eight) hours as needed. For pain      . diphenhydrAMINE (BENADRYL) 25 mg capsule Take 25 mg by mouth every 6 (six) hours as needed. For allergies      . pantoprazole (PROTONIX) 20 MG tablet Take 20 mg by mouth daily.      . Prenatal Vit-Fe Fumarate-FA (PRENATAL MULTIVITAMIN) TABS Take 1 tablet by mouth daily.      . promethazine (PHENERGAN) 25 MG tablet Take 25 mg by mouth every 6 (six) hours as needed. For nausea      . sertraline (ZOLOFT) 100 MG  tablet Take 100 mg by mouth daily.        Review of Systems  Constitutional: Negative for fever and chills.  Eyes: Negative for blurred vision and double vision.  Gastrointestinal: Negative for nausea and vomiting.  Genitourinary: Negative for dysuria and urgency.  Neurological: Negative for dizziness and headaches.   Physical Exam   Blood pressure 127/59, pulse 91, temperature 98.2 F (36.8 C), temperature source Oral, resp. rate 18, height 5\' 4"  (1.626 m), weight 110.224 kg (243 lb).  Physical Exam  Constitutional: She is oriented to person, place, and time. She appears well-developed and well-nourished.  HENT:  Head: Normocephalic and atraumatic.  Eyes: Conjunctivae normal and EOM are normal.  Cardiovascular: Normal rate and normal heart sounds.   Respiratory: Effort normal and breath sounds normal. No respiratory distress.  GI: Soft. There is no rebound and no guarding.  Genitourinary:       Normal external genitalia. Normal vagina. Small amt of watery discharge pooling slightly in posterior vagina. Thick yellow/white discharge at cervix. Cervix red. Vagina. tenderness/pain with speculum exam.   Musculoskeletal: She exhibits no edema and no tenderness.  Neurological:  She is alert and oriented to person, place, and time.  Skin: Skin is warm and dry.  Psychiatric: She has a normal mood and affect.    Fern negative. Cervix:  Closed, thick, -3.  Results for orders placed during the hospital encounter of 05/12/12 (from the past 24 hour(s))  URINALYSIS, ROUTINE W REFLEX MICROSCOPIC     Status: Abnormal   Collection Time   05/12/12  6:55 PM      Component Value Range   Color, Urine YELLOW  YELLOW   APPearance CLEAR  CLEAR   Specific Gravity, Urine 1.020  1.005 - 1.030   pH 6.0  5.0 - 8.0   Glucose, UA NEGATIVE  NEGATIVE mg/dL   Hgb urine dipstick NEGATIVE  NEGATIVE   Bilirubin Urine NEGATIVE  NEGATIVE   Ketones, ur NEGATIVE  NEGATIVE mg/dL   Protein, ur NEGATIVE  NEGATIVE  mg/dL   Urobilinogen, UA 0.2  0.0 - 1.0 mg/dL   Nitrite NEGATIVE  NEGATIVE   Leukocytes, UA SMALL (*) NEGATIVE  URINE MICROSCOPIC-ADD ON     Status: Abnormal   Collection Time   05/12/12  6:55 PM      Component Value Range   Squamous Epithelial / LPF MANY (*) RARE   WBC, UA 7-10  <3 WBC/hpf   RBC / HPF 3-6  <3 RBC/hpf   Bacteria, UA FEW (*) RARE  WET PREP, GENITAL     Status: Abnormal   Collection Time   05/12/12  7:18 PM      Component Value Range   Yeast Wet Prep HPF POC NONE SEEN  NONE SEEN   Trich, Wet Prep NONE SEEN  NONE SEEN   Clue Cells Wet Prep HPF POC MODERATE (*) NONE SEEN   WBC, Wet Prep HPF POC MANY (*) NONE SEEN     MAU Course  Procedures  NST:  145, + accels, mod variability, no decels TOCO:  No contractions.  Assessment and Plan  36 y.o. [id number] at [redacted]w[redacted]d with 1.  Bacterial vaginosis 2.  IUP:  NST reactive 3. Discussed with Dr. Senaida Ores - home on flagyl, f/u as scheduled  Napoleon Form 05/12/2012, 7:15 PM

## 2012-05-15 LAB — URINE CULTURE: Colony Count: >=100000

## 2012-06-06 ENCOUNTER — Other Ambulatory Visit: Payer: Self-pay | Admitting: Obstetrics and Gynecology

## 2012-06-08 ENCOUNTER — Other Ambulatory Visit (HOSPITAL_COMMUNITY): Payer: Medicaid Other

## 2012-06-22 ENCOUNTER — Encounter (HOSPITAL_COMMUNITY): Payer: Self-pay | Admitting: *Deleted

## 2012-06-22 ENCOUNTER — Inpatient Hospital Stay (HOSPITAL_COMMUNITY)
Admission: AD | Admit: 2012-06-22 | Discharge: 2012-06-22 | Disposition: A | Discharge status: Home / Self Care | Payer: Medicaid Other | Source: Ambulatory Visit | Attending: Obstetrics and Gynecology | Admitting: Obstetrics and Gynecology

## 2012-06-22 DIAGNOSIS — O4700 False labor before 37 completed weeks of gestation, unspecified trimester: Secondary | ICD-10-CM | POA: Insufficient documentation

## 2012-06-22 DIAGNOSIS — O479 False labor, unspecified: Secondary | ICD-10-CM

## 2012-06-22 LAB — URINALYSIS, ROUTINE W REFLEX MICROSCOPIC
Bilirubin Urine: NEGATIVE
Glucose, UA: NEGATIVE mg/dL
Hgb urine dipstick: NEGATIVE
Specific Gravity, Urine: 1.02 (ref 1.005–1.030)
Urobilinogen, UA: 0.2 mg/dL (ref 0.0–1.0)
pH: 6 (ref 5.0–8.0)

## 2012-06-22 MED ORDER — ZOLPIDEM TARTRATE 5 MG PO TABS
5.0000 mg | ORAL_TABLET | Freq: Once | ORAL | Status: AC
  Administered 2012-06-22: 5 mg via ORAL
  Filled 2012-06-22: qty 1

## 2012-06-22 MED ORDER — TERBUTALINE SULFATE 1 MG/ML IJ SOLN
0.2500 mg | Freq: Once | INTRAMUSCULAR | Status: AC
  Administered 2012-06-22: 0.25 mg via SUBCUTANEOUS
  Filled 2012-06-22: qty 1

## 2012-06-22 NOTE — Progress Notes (Signed)
D.Poe CNM informed of pt's presence on the unit, report given. Informed that pt is contracting

## 2012-06-22 NOTE — MAU Note (Signed)
Pt states she has been having contractions since 2030

## 2012-06-22 NOTE — MAU Provider Note (Signed)
Chief Complaint:  Contractions   First Provider Initiated Contact with Patient 06/22/12 0326      HPI: Kelly Clay is a 36 y.o. [id number] at [redacted]w[redacted]d who presents to maternity admissions reporting uncomfortable contractions since 2030 on 06/21/2012. She's also had some pelvic pressure and low back pain for 1- 2 days. Denies leakage of fluid or vaginal bleeding. Good fetal movement.   Pregnancy Course: uncomplicated except BP elevations and NSTs in office last 2 visits  Past Medical History: Past Medical History  Diagnosis Date  . Depression   . Anxiety   . Complication of anesthesia     epidurals did not take  . Pregnancy induced hypertension   . Preterm labor     induced at 36wks, PIH  . Urinary tract infection   . Abnormal Pap smear     colpo    Past obstetric history: OB History    Grav Para Term Preterm Abortions TAB SAB Ect Mult Living   5 2 1 1 2  0 2 0 0 2     # Outc Date GA Lbr Len/2nd Wgt Sex Del Anes PTL Lv   1 TRM 12/95    F SVD EPI No Yes   2 PRE 8/03 [redacted]w[redacted]d   M SVD EPI  Yes   Comments: induced for pre-eclampsia   3 SAB            4 SAB            5 CUR               Past Surgical History: Past Surgical History  Procedure Date  . Tonsillectomy     Family History: Family History  Problem Relation Age of Onset  . Other Neg Hx   . Hypertension Mother   . COPD Mother   . Heart disease Mother   . Hypertension Father   . Diabetes Father     Social History: History  Substance Use Topics  . Smoking status: Never Smoker   . Smokeless tobacco: Never Used  . Alcohol Use: No     Comment: prior to preg    Allergies:  Allergies  Allergen Reactions  . Imitrex (Sumatriptan) Anaphylaxis, Swelling and Other (See Comments)    In throat. Numbness/tigling in body    Meds:  Prescriptions prior to admission  Medication Sig Dispense Refill  . acetaminophen (TYLENOL) 325 MG tablet Take 650 mg by mouth every 8 (eight) hours as needed. For pain      .  diphenhydrAMINE (BENADRYL) 25 mg capsule Take 25 mg by mouth every 6 (six) hours as needed. For allergies      . pantoprazole (PROTONIX) 20 MG tablet Take 20 mg by mouth daily.      . Prenatal Vit-Fe Fumarate-FA (PRENATAL MULTIVITAMIN) TABS Take 1 tablet by mouth daily.      . promethazine (PHENERGAN) 25 MG tablet Take 25 mg by mouth every 6 (six) hours as needed. For nausea      . sertraline (ZOLOFT) 100 MG tablet Take 100 mg by mouth daily.        ROS: Pertinent findings in history of present illness.  Physical Exam  Blood pressure 127/67, pulse 93, temperature 98.3 F (36.8 C), temperature source Oral, resp. rate 18. GENERAL: Well-developed, well-nourished female in no acute distress.  HEENT: normocephalic HEART: normal rate RESP: normal effort ABDOMEN: Soft, non-tender, gravid appropriate for gestational age EXTREMITIES: Nontender, no edema NEURO: alert and oriented   Dilation: Closed (0.5  external) Effacement (%): Thick Cervical Position: Posterior Station: -3 Exam by:: D. Imanie Darrow CNM  FHT:  Baseline 120-125 , moderate variability, accelerations present, no decelerations Contractions: q 3-8 mins   Labs: Results for orders placed during the hospital encounter of 06/22/12 (from the past 24 hour(s))  URINALYSIS, ROUTINE W REFLEX MICROSCOPIC     Status: Normal   Collection Time   06/22/12  1:45 AM      Component Value Range   Color, Urine YELLOW  YELLOW   APPearance CLEAR  CLEAR   Specific Gravity, Urine 1.020  1.005 - 1.030   pH 6.0  5.0 - 8.0   Glucose, UA NEGATIVE  NEGATIVE mg/dL   Hgb urine dipstick NEGATIVE  NEGATIVE   Bilirubin Urine NEGATIVE  NEGATIVE   Ketones, ur NEGATIVE  NEGATIVE mg/dL   Protein, ur NEGATIVE  NEGATIVE mg/dL   Urobilinogen, UA 0.2  0.0 - 1.0 mg/dL   Nitrite NEGATIVE  NEGATIVE   Leukocytes, UA NEGATIVE  NEGATIVE    MAU Course: C/W Dr. Caroleen Hamman terbutaline .25mg  SQ given and UCs diminished to mild, occasional Ambien 10 mg po  given   Assessment: 1. Preterm uterine contractions   [id number] @[redacted]w[redacted]d   Plan: Discharge home Labor precautions and fetal kick counts  Follow-up Information    Follow up with Oliver Pila, MD. On 06/26/2012.   Contact information:   [address], [address] [place] [address] [phone]           Medication List     As of 06/22/2012  4:53 AM    TAKE these medications         acetaminophen 325 MG tablet   Commonly known as: TYLENOL   Take 650 mg by mouth every 8 (eight) hours as needed. For pain      diphenhydrAMINE 25 mg capsule   Commonly known as: BENADRYL   Take 25 mg by mouth every 6 (six) hours as needed. For allergies      pantoprazole 20 MG tablet   Commonly known as: PROTONIX   Take 20 mg by mouth daily.      prenatal multivitamin Tabs   Take 1 tablet by mouth daily.      promethazine 25 MG tablet   Commonly known as: PHENERGAN   Take 25 mg by mouth every 6 (six) hours as needed. For nausea      sertraline 100 MG tablet   Commonly known as: ZOLOFT   Take 100 mg by mouth daily.         Danae Orleans, CNM 06/22/2012 3:28 AM

## 2012-07-18 ENCOUNTER — Inpatient Hospital Stay (HOSPITAL_COMMUNITY)
Admission: AD | Admit: 2012-07-18 | Discharge: 2012-07-19 | Disposition: A | Discharge status: Home / Self Care | Payer: Medicaid Other | Source: Ambulatory Visit | Attending: Obstetrics and Gynecology | Admitting: Obstetrics and Gynecology

## 2012-07-18 ENCOUNTER — Encounter (HOSPITAL_COMMUNITY): Payer: Self-pay

## 2012-07-18 DIAGNOSIS — O479 False labor, unspecified: Secondary | ICD-10-CM | POA: Insufficient documentation

## 2012-07-18 LAB — OB RESULTS CONSOLE ABO/RH: RH Type: POSITIVE

## 2012-07-18 LAB — OB RESULTS CONSOLE GBS: GBS: NEGATIVE

## 2012-07-18 NOTE — MAU Note (Signed)
Contractions every 5 minutes x3 hours. Denies leaking of fluid or vaginal bleeding. Positive fetal movement.

## 2012-07-19 ENCOUNTER — Encounter (HOSPITAL_COMMUNITY): Payer: Self-pay | Admitting: *Deleted

## 2012-07-19 ENCOUNTER — Telehealth (HOSPITAL_COMMUNITY): Payer: Self-pay | Admitting: *Deleted

## 2012-07-19 NOTE — Telephone Encounter (Signed)
Preadmission screen  

## 2012-07-24 ENCOUNTER — Encounter (HOSPITAL_COMMUNITY): Payer: Self-pay | Admitting: *Deleted

## 2012-07-24 ENCOUNTER — Observation Stay (HOSPITAL_COMMUNITY)
Admission: AD | Admit: 2012-07-24 | Discharge: 2012-07-24 | Disposition: A | Discharge status: Home / Self Care | Payer: Medicaid Other | Source: Ambulatory Visit | Attending: Obstetrics and Gynecology | Admitting: Obstetrics and Gynecology

## 2012-07-24 ENCOUNTER — Inpatient Hospital Stay (HOSPITAL_COMMUNITY): Admission: RE | Admit: 2012-07-24 | Payer: Medicaid Other | Source: Ambulatory Visit

## 2012-07-24 DIAGNOSIS — O321XX0 Maternal care for breech presentation, not applicable or unspecified: Principal | ICD-10-CM | POA: Insufficient documentation

## 2012-07-24 DIAGNOSIS — O479 False labor, unspecified: Secondary | ICD-10-CM | POA: Insufficient documentation

## 2012-07-24 MED ORDER — TERBUTALINE SULFATE 1 MG/ML IJ SOLN
0.2500 mg | Freq: Once | INTRAMUSCULAR | Status: AC
  Administered 2012-07-24: 0.25 mg via SUBCUTANEOUS

## 2012-07-24 MED ORDER — TERBUTALINE SULFATE 1 MG/ML IJ SOLN
INTRAMUSCULAR | Status: AC
  Filled 2012-07-24: qty 1

## 2012-07-24 NOTE — Progress Notes (Signed)
Dr. Jackelyn Knife at bedside and notified of terbutaline given, pt's reaction, decreased BP, pt feeling nausea, dizziness, and light-headed.  Dr. Jackelyn Knife also notified of FHR and UC pattern.

## 2012-07-24 NOTE — Procedures (Signed)
Here for ECV.  FHT reactive with irreg ctx.  Given SQ terb, became nauseated with BP 78/38, resolved after about 5 minutes.  Reviewed ECV, consent signed.  U/s confirms breech, subj nl AFV.  Attempted EVC x 4, 2 backward and 2 forward rolls, unable to get breech out of pelvis, pt uncomfortable.  FHT ok throughout by u/s. Will schedule c-section for this week, discussed procedure and risks.

## 2012-07-25 ENCOUNTER — Encounter (HOSPITAL_COMMUNITY): Payer: Medicaid Other

## 2012-07-25 ENCOUNTER — Inpatient Hospital Stay (HOSPITAL_COMMUNITY): Admission: RE | Admit: 2012-07-25 | Payer: Medicaid Other | Source: Ambulatory Visit

## 2012-07-25 ENCOUNTER — Encounter (HOSPITAL_COMMUNITY): Payer: Self-pay | Admitting: *Deleted

## 2012-07-25 ENCOUNTER — Inpatient Hospital Stay (HOSPITAL_COMMUNITY): Admission: RE | Admit: 2012-07-25 | Payer: Self-pay | Source: Ambulatory Visit

## 2012-07-25 ENCOUNTER — Encounter (HOSPITAL_COMMUNITY): Payer: Self-pay

## 2012-07-25 ENCOUNTER — Encounter (HOSPITAL_COMMUNITY)
Admission: RE | Admit: 2012-07-25 | Discharge: 2012-07-25 | Disposition: A | Discharge status: Home / Self Care | Payer: Medicaid Other | Source: Ambulatory Visit | Attending: Obstetrics and Gynecology | Admitting: Obstetrics and Gynecology

## 2012-07-25 LAB — SURGICAL PCR SCREEN: MRSA, PCR: NEGATIVE

## 2012-07-25 LAB — CBC
HCT: 35.9 % — ABNORMAL LOW (ref 36.0–46.0)
Platelets: 241 10*3/uL (ref 150–400)
RDW: 13.8 % (ref 11.5–15.5)
WBC: 10.4 10*3/uL (ref 4.0–10.5)

## 2012-07-25 LAB — RPR: RPR Ser Ql: NONREACTIVE

## 2012-07-25 MED ORDER — DEXTROSE 5 % IV SOLN
3.0000 g | INTRAVENOUS | Status: AC
  Filled 2012-07-25: qty 3000

## 2012-07-25 NOTE — Patient Instructions (Addendum)
   Your procedure is scheduled UJ:[id number] January 16th Enter through the Main Entrance of [facility] at:11am Pick up the phone at the desk and dial [phone] and inform us of your arrival.  Please call this number if you have any problems the morning of surgery: [phone]  Remember: Do not eat food after midnight:on Wednesday You may drink clear liquids until 8:30 am on Thursday then nothing  Do not wear jewelry, make-up, or FINGER nail polish No metal in your hair or on your body. Do not wear lotions, powders, perfumes. You may wear deodorant.  Please use your CHG wash as directed prior to surgery.  Do not shave anywhere for at least 12 hours prior to first CHG shower.  Do not bring valuables to the hospital.   Leave suitcase in the car. After Surgery it may be brought to your room. For patients being admitted to the hospital, checkout time is 11:00am the day of discharge.

## 2012-07-25 NOTE — H&P (Signed)
Kelly Clay is a 37 y.o. female [phone] at 39+weeks (EDD 1/22 by 8 week [place]) presenting for scheduled c-section for persistent breech presentation failing version.  Prenatal care complicated by anxiety and depression, managed on zoloft.  No other significant issues.  Maternal Medical History:  Contractions: Frequency: irregular.   Perceived severity is mild.    Fetal activity: Perceived fetal activity is normal.      OB History    Grav Para Term Preterm Abortions TAB SAB Ect Mult Living   5 2 1 1 2 1 1  0 0 2    NSVD 6#13oz 1995 NSVD 5#1oz 2003 EAB x 1  Past Medical History  Diagnosis Date  . Depression   . Anxiety   . Complication of anesthesia     epidurals did not take  . Preterm labor     induced at 36wks, PIH  . Urinary tract infection   . Abnormal Pap smear     colpo  . AMA (advanced maternal age) multigravida 35+   . H/O hiatal hernia   . Headache    Past Surgical History  Procedure Date  . Tonsillectomy   . Dilation and curettage of uterus    Family History: family history includes Birth defects in her son; COPD in her mother; Cancer in her maternal grandmother; Diabetes in her father; Heart disease in her mother; Hypertension in her father and mother; and Thyroid disease in her mother.  There is no history of Other. Social History:  reports that she has never smoked. She has never used smokeless tobacco. She reports that she does not drink alcohol or use illicit drugs.   Prenatal Transfer Tool  Maternal Diabetes: No Genetic Screening: Normal Maternal Ultrasounds/Referrals: Normal Fetal Ultrasounds or other Referrals:  None Maternal Substance Abuse:  No Significant Maternal Medications:  Meds include: Zoloft Significant Maternal Lab Results:  None Other Comments:  None  ROS    There were no vitals taken for this visit. Maternal Exam:  Uterine Assessment: Contraction strength is mild.  Contraction frequency is irregular.   Abdomen: Patient reports no  abdominal tenderness. Fetal presentation: breech  Introitus: Normal vulva. Normal vagina.    Physical Exam  Constitutional: She is oriented to person, place, and time. She appears well-developed and well-nourished.  Cardiovascular: Normal rate and regular rhythm.   Respiratory: Effort normal and breath sounds normal.  GI: Soft.  Genitourinary: Vagina normal and uterus normal.  Neurological: She is alert and oriented to person, place, and time.  Psychiatric: She has a normal mood and affect. Her behavior is normal.    Prenatal labs: ABO, Rh: A/Positive/-- (01/08 0000) Antibody: Negative (06/20 0000) Rubella: Immune (06/20 0000) RPR: NON REACTIVE (01/15 1415)  HBsAg: Negative (06/20 0000)  HIV: Non-reactive (06/20 0000)  GBS: Negative (01/08 0000)  One hour GCT 125 First trimester screen and AFP WNL  Assessment/Plan: Pt for scheduled C-section for breech presentation failing version.  Risks and benefits reviewed in detail  Including bleeding,infection and possible damage to bowel and bladder.  Desires to proceed.  Oliver Pila 07/25/2012, 5:26 PM

## 2012-07-26 ENCOUNTER — Inpatient Hospital Stay (HOSPITAL_COMMUNITY)
Admission: AD | Admit: 2012-07-26 | Discharge: 2012-07-29 | DRG: 766 | Disposition: A | Discharge status: Home / Self Care | Payer: Medicaid Other | Source: Ambulatory Visit | Attending: Obstetrics and Gynecology | Admitting: Obstetrics and Gynecology

## 2012-07-26 ENCOUNTER — Inpatient Hospital Stay (HOSPITAL_COMMUNITY): Payer: Medicaid Other

## 2012-07-26 ENCOUNTER — Encounter (HOSPITAL_COMMUNITY): Payer: Self-pay | Admitting: Anesthesiology

## 2012-07-26 ENCOUNTER — Inpatient Hospital Stay (HOSPITAL_COMMUNITY)
Admission: RE | Admit: 2012-07-26 | Payer: Medicaid Other | Source: Ambulatory Visit | Admitting: Obstetrics and Gynecology

## 2012-07-26 ENCOUNTER — Inpatient Hospital Stay (HOSPITAL_COMMUNITY): Payer: Medicaid Other | Admitting: Anesthesiology

## 2012-07-26 ENCOUNTER — Encounter (HOSPITAL_COMMUNITY): Admission: RE | Payer: Self-pay | Source: Ambulatory Visit

## 2012-07-26 ENCOUNTER — Encounter (HOSPITAL_COMMUNITY): Payer: Self-pay

## 2012-07-26 ENCOUNTER — Encounter (HOSPITAL_COMMUNITY)
Admission: AD | Disposition: A | Discharge status: Home / Self Care | Payer: Self-pay | Source: Ambulatory Visit | Attending: Obstetrics and Gynecology

## 2012-07-26 DIAGNOSIS — Z01812 Encounter for preprocedural laboratory examination: Secondary | ICD-10-CM

## 2012-07-26 DIAGNOSIS — O321XX0 Maternal care for breech presentation, not applicable or unspecified: Principal | ICD-10-CM | POA: Diagnosis present

## 2012-07-26 DIAGNOSIS — Z98891 History of uterine scar from previous surgery: Secondary | ICD-10-CM

## 2012-07-26 SURGERY — Surgical Case
Anesthesia: Choice

## 2012-07-26 SURGERY — Surgical Case
Anesthesia: Spinal | Site: Abdomen | Wound class: Clean Contaminated

## 2012-07-26 MED ORDER — 0.9 % SODIUM CHLORIDE (POUR BTL) OPTIME
TOPICAL | Status: DC | PRN
  Administered 2012-07-26: 1000 mL

## 2012-07-26 MED ORDER — EPHEDRINE SULFATE 50 MG/ML IJ SOLN
INTRAMUSCULAR | Status: DC | PRN
  Administered 2012-07-26 (×3): 10 mg via INTRAVENOUS

## 2012-07-26 MED ORDER — SIMETHICONE 80 MG PO CHEW: 80.0000 mg | CHEWABLE_TABLET | ORAL | Status: DC | PRN

## 2012-07-26 MED ORDER — FENTANYL CITRATE 0.05 MG/ML IJ SOLN
INTRAMUSCULAR | Status: DC | PRN
  Administered 2012-07-26: 25 ug via INTRATHECAL

## 2012-07-26 MED ORDER — FENTANYL CITRATE 0.05 MG/ML IJ SOLN
INTRAMUSCULAR | Status: AC
  Filled 2012-07-26: qty 2

## 2012-07-26 MED ORDER — ONDANSETRON HCL 4 MG/2ML IJ SOLN: 4.0000 mg | INTRAMUSCULAR | Status: DC | PRN

## 2012-07-26 MED ORDER — KETOROLAC TROMETHAMINE 30 MG/ML IJ SOLN: 30.0000 mg | Freq: Four times a day (QID) | INTRAMUSCULAR | Status: AC | PRN

## 2012-07-26 MED ORDER — ACETAMINOPHEN 10 MG/ML IV SOLN
1000.0000 mg | Freq: Four times a day (QID) | INTRAVENOUS | Status: AC | PRN
  Administered 2012-07-26: 1000 mg via INTRAVENOUS
  Filled 2012-07-26 (×2): qty 100

## 2012-07-26 MED ORDER — SERTRALINE HCL 100 MG PO TABS
100.0000 mg | ORAL_TABLET | Freq: Every day | ORAL | Status: DC
  Administered 2012-07-26 – 2012-07-28 (×3): 100 mg via ORAL
  Filled 2012-07-26 (×4): qty 1

## 2012-07-26 MED ORDER — IBUPROFEN 600 MG PO TABS
600.0000 mg | ORAL_TABLET | Freq: Four times a day (QID) | ORAL | Status: DC | PRN
  Filled 2012-07-26 (×7): qty 1

## 2012-07-26 MED ORDER — MEPERIDINE HCL 25 MG/ML IJ SOLN: 6.2500 mg | INTRAMUSCULAR | Status: DC | PRN

## 2012-07-26 MED ORDER — DIBUCAINE 1 % RE OINT: 1.0000 "application " | TOPICAL_OINTMENT | RECTAL | Status: DC | PRN

## 2012-07-26 MED ORDER — DIPHENHYDRAMINE HCL 25 MG PO CAPS
25.0000 mg | ORAL_CAPSULE | Freq: Four times a day (QID) | ORAL | Status: DC | PRN
  Administered 2012-07-27: 25 mg via ORAL
  Filled 2012-07-26: qty 1

## 2012-07-26 MED ORDER — SODIUM CHLORIDE 0.9 % IJ SOLN: 3.0000 mL | INTRAMUSCULAR | Status: DC | PRN

## 2012-07-26 MED ORDER — MORPHINE SULFATE (PF) 0.5 MG/ML IJ SOLN
INTRAMUSCULAR | Status: DC | PRN
  Administered 2012-07-26: .15 mg via INTRATHECAL

## 2012-07-26 MED ORDER — MENTHOL 3 MG MT LOZG
1.0000 | LOZENGE | OROMUCOSAL | Status: DC | PRN
  Administered 2012-07-27: 3 mg via ORAL
  Filled 2012-07-26: qty 9

## 2012-07-26 MED ORDER — DIPHENHYDRAMINE HCL 50 MG/ML IJ SOLN: 25.0000 mg | INTRAMUSCULAR | Status: DC | PRN

## 2012-07-26 MED ORDER — ONDANSETRON HCL 4 MG PO TABS: 4.0000 mg | ORAL_TABLET | ORAL | Status: DC | PRN

## 2012-07-26 MED ORDER — NALBUPHINE HCL 10 MG/ML IJ SOLN
5.0000 mg | INTRAMUSCULAR | Status: DC | PRN
  Filled 2012-07-26: qty 1

## 2012-07-26 MED ORDER — IBUPROFEN 600 MG PO TABS
600.0000 mg | ORAL_TABLET | Freq: Four times a day (QID) | ORAL | Status: DC
  Administered 2012-07-26 – 2012-07-29 (×11): 600 mg via ORAL
  Filled 2012-07-26 (×4): qty 1

## 2012-07-26 MED ORDER — WITCH HAZEL-GLYCERIN EX PADS: 1.0000 "application " | MEDICATED_PAD | CUTANEOUS | Status: DC | PRN

## 2012-07-26 MED ORDER — FENTANYL CITRATE 0.05 MG/ML IJ SOLN: 25.0000 ug | INTRAMUSCULAR | Status: DC | PRN

## 2012-07-26 MED ORDER — LACTATED RINGERS IV SOLN
INTRAVENOUS | Status: DC | PRN
  Administered 2012-07-26 (×3): via INTRAVENOUS

## 2012-07-26 MED ORDER — CEFAZOLIN SODIUM-DEXTROSE 2-3 GM-% IV SOLR
INTRAVENOUS | Status: DC | PRN
  Administered 2012-07-26: 2 g via INTRAVENOUS

## 2012-07-26 MED ORDER — KETOROLAC TROMETHAMINE 30 MG/ML IJ SOLN
30.0000 mg | Freq: Four times a day (QID) | INTRAMUSCULAR | Status: AC | PRN
  Administered 2012-07-26 (×2): 30 mg via INTRAVENOUS
  Filled 2012-07-26: qty 1

## 2012-07-26 MED ORDER — DIPHENHYDRAMINE HCL 25 MG PO CAPS
25.0000 mg | ORAL_CAPSULE | ORAL | Status: DC | PRN
  Administered 2012-07-26: 25 mg via ORAL
  Filled 2012-07-26 (×2): qty 1

## 2012-07-26 MED ORDER — ONDANSETRON HCL 4 MG/2ML IJ SOLN
INTRAMUSCULAR | Status: AC
  Filled 2012-07-26: qty 2

## 2012-07-26 MED ORDER — NALOXONE HCL 1 MG/ML IJ SOLN: 1.0000 ug/kg/h | INTRAVENOUS | Status: DC | PRN

## 2012-07-26 MED ORDER — ZOLPIDEM TARTRATE 5 MG PO TABS: 5.0000 mg | ORAL_TABLET | Freq: Every evening | ORAL | Status: DC | PRN

## 2012-07-26 MED ORDER — MORPHINE SULFATE 0.5 MG/ML IJ SOLN
INTRAMUSCULAR | Status: AC
  Filled 2012-07-26: qty 10

## 2012-07-26 MED ORDER — OXYTOCIN 40 UNITS IN LACTATED RINGERS INFUSION - SIMPLE MED: 62.5000 mL/h | INTRAVENOUS | Status: AC

## 2012-07-26 MED ORDER — NALBUPHINE HCL 10 MG/ML IJ SOLN
5.0000 mg | INTRAMUSCULAR | Status: DC | PRN
  Administered 2012-07-26: 10 mg via SUBCUTANEOUS
  Filled 2012-07-26 (×2): qty 1

## 2012-07-26 MED ORDER — SIMETHICONE 80 MG PO CHEW
80.0000 mg | CHEWABLE_TABLET | Freq: Three times a day (TID) | ORAL | Status: DC
  Administered 2012-07-26 – 2012-07-29 (×11): 80 mg via ORAL

## 2012-07-26 MED ORDER — SCOPOLAMINE 1 MG/3DAYS TD PT72
1.0000 | MEDICATED_PATCH | Freq: Once | TRANSDERMAL | Status: AC
  Administered 2012-07-26: 1.5 mg via TRANSDERMAL

## 2012-07-26 MED ORDER — CITRIC ACID-SODIUM CITRATE 334-500 MG/5ML PO SOLN
30.0000 mL | Freq: Once | ORAL | Status: AC
  Administered 2012-07-26: 30 mL via ORAL
  Filled 2012-07-26: qty 15

## 2012-07-26 MED ORDER — DIPHENHYDRAMINE HCL 50 MG/ML IJ SOLN
12.5000 mg | INTRAMUSCULAR | Status: DC | PRN
  Administered 2012-07-26: 12.5 mg via INTRAVENOUS

## 2012-07-26 MED ORDER — METOCLOPRAMIDE HCL 5 MG/ML IJ SOLN
INTRAMUSCULAR | Status: AC
  Filled 2012-07-26: qty 2

## 2012-07-26 MED ORDER — METOCLOPRAMIDE HCL 5 MG/ML IJ SOLN
10.0000 mg | Freq: Three times a day (TID) | INTRAMUSCULAR | Status: DC | PRN
  Administered 2012-07-26: 10 mg via INTRAVENOUS

## 2012-07-26 MED ORDER — OXYCODONE-ACETAMINOPHEN 5-325 MG PO TABS
1.0000 | ORAL_TABLET | ORAL | Status: DC | PRN
  Administered 2012-07-26 (×2): 1 via ORAL
  Administered 2012-07-27 – 2012-07-28 (×6): 2 via ORAL
  Administered 2012-07-28 (×2): 1 via ORAL
  Administered 2012-07-29: 2 via ORAL
  Filled 2012-07-26 (×2): qty 2
  Filled 2012-07-26: qty 1
  Filled 2012-07-26 (×3): qty 2
  Filled 2012-07-26 (×2): qty 1
  Filled 2012-07-26: qty 2
  Filled 2012-07-26: qty 1
  Filled 2012-07-26: qty 2

## 2012-07-26 MED ORDER — EPHEDRINE 5 MG/ML INJ
INTRAVENOUS | Status: AC
  Filled 2012-07-26: qty 10

## 2012-07-26 MED ORDER — OXYTOCIN 10 UNIT/ML IJ SOLN
INTRAMUSCULAR | Status: AC
Start: 2012-07-26 — End: 2012-07-26
  Filled 2012-07-26: qty 4

## 2012-07-26 MED ORDER — ONDANSETRON HCL 4 MG/2ML IJ SOLN: 4.0000 mg | Freq: Three times a day (TID) | INTRAMUSCULAR | Status: DC | PRN

## 2012-07-26 MED ORDER — LACTATED RINGERS IV SOLN
INTRAVENOUS | Status: DC | PRN
  Administered 2012-07-26: 05:00:00 via INTRAVENOUS

## 2012-07-26 MED ORDER — SCOPOLAMINE 1 MG/3DAYS TD PT72
MEDICATED_PATCH | TRANSDERMAL | Status: AC
  Filled 2012-07-26: qty 1

## 2012-07-26 MED ORDER — NALOXONE HCL 0.4 MG/ML IJ SOLN: 0.4000 mg | INTRAMUSCULAR | Status: DC | PRN

## 2012-07-26 MED ORDER — LANOLIN HYDROUS EX OINT: 1.0000 "application " | TOPICAL_OINTMENT | CUTANEOUS | Status: DC | PRN

## 2012-07-26 MED ORDER — TETANUS-DIPHTH-ACELL PERTUSSIS 5-2.5-18.5 LF-MCG/0.5 IM SUSP: 0.5000 mL | Freq: Once | INTRAMUSCULAR | Status: DC

## 2012-07-26 MED ORDER — SENNOSIDES-DOCUSATE SODIUM 8.6-50 MG PO TABS
2.0000 | ORAL_TABLET | Freq: Every day | ORAL | Status: DC
  Administered 2012-07-26 – 2012-07-28 (×3): 2 via ORAL

## 2012-07-26 MED ORDER — OXYTOCIN 10 UNIT/ML IJ SOLN
40.0000 [IU] | INTRAVENOUS | Status: DC | PRN
  Administered 2012-07-26: 40 [IU] via INTRAVENOUS

## 2012-07-26 MED ORDER — PRENATAL MULTIVITAMIN CH
1.0000 | ORAL_TABLET | Freq: Every day | ORAL | Status: DC
  Administered 2012-07-26 – 2012-07-28 (×3): 1 via ORAL
  Filled 2012-07-26 (×3): qty 1

## 2012-07-26 MED ORDER — ONDANSETRON HCL 4 MG/2ML IJ SOLN
INTRAMUSCULAR | Status: DC | PRN
  Administered 2012-07-26: 4 mg via INTRAVENOUS

## 2012-07-26 MED ORDER — LACTATED RINGERS IV SOLN
INTRAVENOUS | Status: DC
  Administered 2012-07-26: 09:00:00 via INTRAVENOUS

## 2012-07-26 MED ORDER — BUPIVACAINE IN DEXTROSE 0.75-8.25 % IT SOLN
INTRATHECAL | Status: DC | PRN
  Administered 2012-07-26: 1.6 mL via INTRATHECAL

## 2012-07-26 MED ORDER — KETOROLAC TROMETHAMINE 30 MG/ML IJ SOLN
INTRAMUSCULAR | Status: AC
  Administered 2012-07-26: 30 mg via INTRAVENOUS
  Filled 2012-07-26: qty 1

## 2012-07-26 MED ORDER — MEASLES, MUMPS & RUBELLA VAC ~~LOC~~ INJ
0.5000 mL | INJECTION | Freq: Once | SUBCUTANEOUS | Status: DC
  Filled 2012-07-26: qty 0.5

## 2012-07-26 MED ORDER — DIPHENHYDRAMINE HCL 50 MG/ML IJ SOLN
INTRAMUSCULAR | Status: AC
  Filled 2012-07-26: qty 1

## 2012-07-26 MED ORDER — MAGNESIUM HYDROXIDE 400 MG/5ML PO SUSP: 30.0000 mL | ORAL | Status: DC | PRN

## 2012-07-26 SURGICAL SUPPLY — 32 items
BENZOIN TINCTURE PRP APPL 2/3 (GAUZE/BANDAGES/DRESSINGS) ×2 IMPLANT
CLOTH BEACON ORANGE TIMEOUT ST (SAFETY) ×2 IMPLANT
CONTAINER PREFILL 10% NBF 15ML (MISCELLANEOUS) IMPLANT
DRAPE LG THREE QUARTER DISP (DRAPES) IMPLANT
DRSG OPSITE POSTOP 4X10 (GAUZE/BANDAGES/DRESSINGS) ×2 IMPLANT
DURAPREP 26ML APPLICATOR (WOUND CARE) IMPLANT
ELECT REM PT RETURN 9FT ADLT (ELECTROSURGICAL) ×2
ELECTRODE REM PT RTRN 9FT ADLT (ELECTROSURGICAL) ×1 IMPLANT
EXTRACTOR VACUUM KIWI (MISCELLANEOUS) IMPLANT
EXTRACTOR VACUUM M CUP 4 TUBE (SUCTIONS) IMPLANT
GLOVE BIO SURGEON STRL SZ8 (GLOVE) ×2 IMPLANT
GLOVE ORTHO TXT STRL SZ7.5 (GLOVE) ×2 IMPLANT
GOWN PREVENTION PLUS LG XLONG (DISPOSABLE) ×4 IMPLANT
KIT ABG SYR 3ML LUER SLIP (SYRINGE) IMPLANT
NEEDLE HYPO 25X5/8 SAFETYGLIDE (NEEDLE) ×2 IMPLANT
NS IRRIG 1000ML POUR BTL (IV SOLUTION) ×2 IMPLANT
PACK C SECTION WH (CUSTOM PROCEDURE TRAY) ×2 IMPLANT
PAD OB MATERNITY 4.3X12.25 (PERSONAL CARE ITEMS) ×2 IMPLANT
RTRCTR C-SECT PINK 25CM LRG (MISCELLANEOUS) ×2 IMPLANT
SLEEVE SCD COMPRESS KNEE MED (MISCELLANEOUS) ×2 IMPLANT
SPONGE LAP 18X18 X RAY DECT (DISPOSABLE) ×2 IMPLANT
STAPLER VISISTAT 35W (STAPLE) IMPLANT
STRIP CLOSURE SKIN 1/2X4 (GAUZE/BANDAGES/DRESSINGS) ×2 IMPLANT
SUT CHROMIC 1 CTX 36 (SUTURE) ×4 IMPLANT
SUT PLAIN 0 NONE (SUTURE) IMPLANT
SUT PLAIN 2 0 XLH (SUTURE) ×2 IMPLANT
SUT VIC AB 0 CT1 27 (SUTURE) ×2
SUT VIC AB 0 CT1 27XBRD ANBCTR (SUTURE) ×2 IMPLANT
SUT VIC AB 4-0 KS 27 (SUTURE) ×2 IMPLANT
TOWEL OR 17X24 6PK STRL BLUE (TOWEL DISPOSABLE) ×6 IMPLANT
TRAY FOLEY CATH 14FR (SET/KITS/TRAYS/PACK) ×2 IMPLANT
WATER STERILE IRR 1000ML POUR (IV SOLUTION) IMPLANT

## 2012-07-26 NOTE — MAU Note (Signed)
Pt to rm 7 -states she has an urge to push-SVE done-5cm-100% with a bulging bag-pt sttes she is breech

## 2012-07-26 NOTE — Op Note (Signed)
Preoperative diagnosis: Intrauterine pregnancy at 39 weeks, active labor, breech presentation Postoperative diagnosis: Same Procedure: Primary low transverse cesarean section without extensions Surgeon: Lavina Hamman M.D. Anesthesia: Spinal   Findings: Patient had normal gravid anatomy and delivered a viable female infant with Apgars of 8 and 9 weight pending, frank breech presentation Estimated blood loss: 800 cc Specimens: Placenta sent to labor and delivery Complications: None  Procedure in detail: The patient was taken to the operating room and placed in the sitting position. Dr. Malen Gauze instilled spinal anesthesia.  She was then placed in the dorsosupine position with left tilt. Abdomen was then prepped and draped in the usual sterile fashion, and a foley catheter was inserted. The level of her anesthesia was found to be adequate. Abdomen was entered via a standard Pfannenstiel incision. Once the peritoneal cavity was entered the Alexis disposable self-retaining retractor was placed and good visualization was achieved. A 4 cm transverse incision was then made in the lower uterine segment pushing the bladder inferior. Once the uterine cavity was entered the incision was extended digitally. The fetal breech was grasped and delivered through the incision, followed easily by the legs, torso, arms and head without difficulty. Mouth and nares were suctioned. Cord was doubly clamped and cut and the infant handed to the awaiting pediatric team. Cord blood was obtained. The placenta delivered spontaneously. Uterus was wiped dry with clean lap pad and all clots and debris were removed. Uterine incision was inspected and found to be free of extensions. Uterine incision was closed in 2 layers with running locking #1 Chromic for the first layer, an imbricating layer also with #1 Chromic for the second layer. Tubes and ovaries were inspected and found to be normal. Uterine incision was inspected and found to be  hemostatic. Bleeding from serosal edges was controlled with electrocautery. The Alexis retractor was removed. Subfascial space was irrigated and made hemostatic with electrocautery. Fascia was closed in running fashion starting at both ends and meeting in the middle with 0 Vicryl. Subcutaneous tissue was then irrigated and made hemostatic with electrocautery, then closed with running 2-0 plain gut. Skin was closed with running subcuticular 4-0 Vicryl followed by a sterile dressing. Patient tolerated the procedure well and was taken to the recovery in stable condition. Counts were correct x2, she received Ancef 2 g IV at the beginning of the procedure and she had PAS hose on throughout the procedure.

## 2012-07-26 NOTE — Anesthesia Preprocedure Evaluation (Deleted)
Anesthesia Evaluation  Patient identified by MRN, date of birth, ID band Patient awake    Airway Mallampati: III TM Distance: >3 FB     Dental No notable dental hx. (+) Teeth Intact   Pulmonary neg pulmonary ROS,  breath sounds clear to auscultation  Pulmonary exam normal       Cardiovascular negative cardio ROS  Rhythm:Regular Rate:Normal     Neuro/Psych  Headaches, Anxiety Depression    GI/Hepatic Neg liver ROS, hiatal hernia, GERD-  Medicated and Controlled,  Endo/Other  Morbid obesity  Renal/GU negative Renal ROS     Musculoskeletal negative musculoskeletal ROS (+)   Abdominal (+) + obese,   Peds  Hematology negative hematology ROS (+)   Anesthesia Other Findings   Reproductive/Obstetrics (+) Pregnancy Breech 39 weeks in labor                           Anesthesia Physical Anesthesia Plan  ASA: III and emergent  Anesthesia Plan:    Post-op Pain Management:    Induction:   Airway Management Planned: Natural Airway  Additional Equipment:   Intra-op Plan:   Post-operative Plan:   Informed Consent: I have reviewed the patients History and Physical, chart, labs and discussed the procedure including the risks, benefits and alternatives for the proposed anesthesia with the patient or authorized representative who has indicated his/her understanding and acceptance.   Dental advisory given  Plan Discussed with: CRNA, Anesthesiologist and Surgeon  Anesthesia Plan Comments:         Anesthesia Quick Evaluation

## 2012-07-26 NOTE — Anesthesia Postprocedure Evaluation (Signed)
  Anesthesia Post-op Note  Patient: Kelly Clay  Procedure(s) Performed: Procedure(s) (LRB) with comments: CESAREAN SECTION (N/A) - Primary Cesarean Section Delivery Baby Girl @ [phone], Apgars 9/9  Patient Location: PACU  Anesthesia Type:Spinal  Level of Consciousness: awake, alert  and oriented  Airway and Oxygen Therapy: Patient Spontanous Breathing  Post-op Pain: none  Post-op Assessment: Post-op Vital signs reviewed and Patient's Cardiovascular Status Stable  Post-op Vital Signs: Reviewed and stable  Complications: No apparent anesthesia complications

## 2012-07-26 NOTE — Transfer of Care (Signed)
Immediate Anesthesia Transfer of Care Note  Patient: Kelly Clay  Procedure(s) Performed: Procedure(s) (LRB) with comments: CESAREAN SECTION (N/A) - Primary Cesarean Section Delivery Baby Girl @ [phone], Apgars 9/9  Patient Location: PACU  Anesthesia Type:Spinal  Level of Consciousness: awake, alert , oriented and patient cooperative  Airway & Oxygen Therapy: Patient Spontanous Breathing  Post-op Assessment: Report given to PACU RN and Post -op Vital signs reviewed and stable  Post vital signs: Reviewed and stable  Complications: No apparent anesthesia complications

## 2012-07-26 NOTE — Progress Notes (Signed)
Pt is here in labor, may now be completely dilated.  Will proceed with c-section now.  She has no questions and nothing else new since H&P.

## 2012-07-26 NOTE — Addendum Note (Signed)
Addendum  created 07/26/12 1256 by Armanda Heritage, RN   Modules edited:Notes Section

## 2012-07-26 NOTE — Anesthesia Postprocedure Evaluation (Signed)
  Anesthesia Post-op Note  Patient: Kelly Clay  Procedure(s) Performed: Procedure(s) (LRB) with comments: CESAREAN SECTION (N/A) - Primary Cesarean Section Delivery Baby Girl @ [phone], Apgars 9/9  Patient Location: PACU and Mother/Baby  Anesthesia Type:Spinal  Level of Consciousness: awake, alert  and oriented  Airway and Oxygen Therapy: Patient Spontanous Breathing  Post-op Pain: mild  Post-op Assessment: Post-op Vital signs reviewed  Post-op Vital Signs: Reviewed and stable  Complications: No apparent anesthesia complications

## 2012-07-26 NOTE — Anesthesia Preprocedure Evaluation (Signed)
Anesthesia Evaluation  Patient identified by MRN, date of birth, ID band Patient awake    Airway Mallampati: III TM Distance: >3 FB     Dental No notable dental hx. (+) Teeth Intact   Pulmonary neg pulmonary ROS,  breath sounds clear to auscultation  Pulmonary exam normal       Cardiovascular negative cardio ROS  Rhythm:Regular Rate:Normal     Neuro/Psych  Headaches, Anxiety Depression    GI/Hepatic Neg liver ROS, hiatal hernia, GERD-  Medicated and Controlled,  Endo/Other  Morbid obesity  Renal/GU negative Renal ROS     Musculoskeletal negative musculoskeletal ROS (+)   Abdominal (+) + obese,   Peds  Hematology negative hematology ROS (+)   Anesthesia Other Findings   Reproductive/Obstetrics (+) Pregnancy Breech 39 weeks in labor                           Anesthesia Physical Anesthesia Plan  ASA: III and emergent  Anesthesia Plan:    Post-op Pain Management:    Induction:   Airway Management Planned: Natural Airway  Additional Equipment:   Intra-op Plan:   Post-operative Plan:   Informed Consent: I have reviewed the patients History and Physical, chart, labs and discussed the procedure including the risks, benefits and alternatives for the proposed anesthesia with the patient or authorized representative who has indicated his/her understanding and acceptance.   Dental advisory given  Plan Discussed with: CRNA, Anesthesiologist and Surgeon  Anesthesia Plan Comments:         Anesthesia Quick Evaluation  

## 2012-07-26 NOTE — Anesthesia Procedure Notes (Signed)
Spinal  Patient location during procedure: OR Start time: 07/26/2012 4:13 AM Staffing Anesthesiologist: Jaxsin Bottomley A. Performed by: anesthesiologist  Preanesthetic Checklist Completed: patient identified, site marked, surgical consent, pre-op evaluation, timeout performed, IV checked, risks and benefits discussed and monitors and equipment checked Spinal Block Patient position: sitting Prep: site prepped and draped and DuraPrep Patient monitoring: heart rate, cardiac monitor, continuous pulse ox and blood pressure Approach: midline Location: L3-4 Injection technique: single-shot Needle Needle type: Sprotte  Needle gauge: 24 G Needle length: 9 cm Needle insertion depth: 7 cm Assessment Sensory level: T4 Additional Notes Patient tolerated procedure well. Adequate sensory level.

## 2012-07-26 NOTE — Progress Notes (Signed)
POD #0 Doing ok, has had some n/v, pain ok Afeb, VSS Abd- soft, fundus firm, incision intact Continue routine care, ambulate

## 2012-07-27 ENCOUNTER — Encounter (HOSPITAL_COMMUNITY): Payer: Self-pay | Admitting: Obstetrics and Gynecology

## 2012-07-27 LAB — CBC
HCT: 29.9 % — ABNORMAL LOW (ref 36.0–46.0)
Hemoglobin: 9.9 g/dL — ABNORMAL LOW (ref 12.0–15.0)
MCH: 29.1 pg (ref 26.0–34.0)
RBC: 3.4 MIL/uL — ABNORMAL LOW (ref 3.87–5.11)

## 2012-07-27 LAB — BIRTH TISSUE RECOVERY COLLECTION (PLACENTA DONATION)

## 2012-07-27 NOTE — Progress Notes (Signed)

## 2012-07-27 NOTE — Progress Notes (Signed)
Subjective: Postpartum Day #1: Cesarean Delivery Patient reports incisional pain, tolerating PO and no problems voiding.    Objective: Vital signs in last 24 hours: Temp:  [97.4 F (36.3 C)-98.5 F (36.9 C)] 97.6 F (36.4 C) (01/17 0548) Pulse Rate:  [65-86] 72  (01/17 0548) Resp:  [18-20] 18  (01/17 0548) BP: (99-122)/(58-71) 100/63 mmHg (01/17 0548) SpO2:  [95 %-97 %] 96 % (01/17 0548)  Physical Exam:  General: alert Lochia: appropriate Uterine Fundus: firm Incision: healing well   Basename 07/27/12 0545 07/25/12 1415  HGB 9.9* 12.3  HCT 29.9* 35.9*    Assessment/Plan: Status post Cesarean section. Doing well postoperatively.  Continue current care, ambulate.  Inesha Sow D 07/27/2012, 8:42 AM

## 2012-07-28 NOTE — Progress Notes (Signed)
Subjective: Postpartum Day 2: Cesarean Delivery Patient reports incisional pain and tolerating PO.  Nl lochia, pain controlled  Objective: Vital signs in last 24 hours: Temp:  [97.8 F (36.6 C)-98.6 F (37 C)] 97.8 F (36.6 C) (01/18 0443) Pulse Rate:  [85-98] 85  (01/18 0443) Resp:  [18] 18  (01/18 0443) BP: (114-130)/(69-74) 114/69 mmHg (01/18 0443) SpO2:  [97 %] 97 % (01/17 2034)  Physical Exam:  General: alert and no distress Lochia: appropriate Uterine Fundus: firm Incision: healing well DVT Evaluation: No evidence of DVT seen on physical exam.   Basename 07/27/12 0545 07/25/12 1415  HGB 9.9* 12.3  HCT 29.9* 35.9*    Assessment/Plan: Status post Cesarean section. Doing well postoperatively.  Continue current care.  D/C in AM  BOVARD,Ruhama Lehew 07/28/2012, 7:42 AM

## 2012-07-29 ENCOUNTER — Encounter (HOSPITAL_COMMUNITY)
Admission: RE | Admit: 2012-07-29 | Discharge: 2012-07-29 | Disposition: A | Discharge status: Home / Self Care | Payer: Medicaid Other | Source: Ambulatory Visit | Attending: Obstetrics and Gynecology | Admitting: Obstetrics and Gynecology

## 2012-07-29 DIAGNOSIS — O923 Agalactia: Secondary | ICD-10-CM | POA: Insufficient documentation

## 2012-07-29 MED ORDER — OXYCODONE-ACETAMINOPHEN 5-325 MG PO TABS: 1.0000 | ORAL_TABLET | Freq: Four times a day (QID) | ORAL | Status: DC | PRN

## 2012-07-29 MED ORDER — IBUPROFEN 800 MG PO TABS: 800.0000 mg | ORAL_TABLET | Freq: Three times a day (TID) | ORAL | Status: DC | PRN

## 2012-07-29 MED ORDER — PRENATAL MULTIVITAMIN CH: 1.0000 | ORAL_TABLET | Freq: Every day | ORAL | Status: DC

## 2012-07-29 NOTE — Progress Notes (Signed)
Subjective: Postpartum Day 3: Cesarean Delivery Patient reports incisional pain, tolerating PO, + flatus, + BM and no problems voiding.  Nl lochia, pain controlled  Objective: Vital signs in last 24 hours: Temp:  [97.7 F (36.5 C)-98.1 F (36.7 C)] 97.8 F (36.6 C) (01/19 0600) Pulse Rate:  [88-107] 88  (01/19 0600) Resp:  [18-20] 20  (01/19 0600) BP: (111-132)/(52-73) 132/52 mmHg (01/19 0600)  Physical Exam:  General: alert and no distress Lochia: appropriate Uterine Fundus: firm Incision: healing well DVT Evaluation: No evidence of DVT seen on physical exam.   Basename 07/27/12 0545  HGB 9.9*  HCT 29.9*    Assessment/Plan: Status post Cesarean section. Doing well postoperatively.  Discharge home with standard precautions and return to clinic in 2 weeks.  D/C with motrin, percocet, pnv.    BOVARD,Kylen Schliep 07/29/2012, 9:22 AM

## 2012-07-29 NOTE — Discharge Summary (Signed)
Obstetric Discharge Summary Reason for Admission: onset of labor Prenatal Procedures: none Intrapartum Procedures: cesarean: low cervical, transverse Postpartum Procedures: none Complications-Operative and Postpartum: none Hemoglobin  Date Value Range Status  07/27/2012 9.9* 12.0 - 15.0 g/dL Final     REPEATED TO VERIFY     DELTA CHECK NOTED     DUE TO LABOR     HCT  Date Value Range Status  07/27/2012 29.9* 36.0 - 46.0 % Final    Physical Exam:  General: alert and no distress Lochia: appropriate Uterine Fundus: firm Incision: healing well DVT Evaluation: No evidence of DVT seen on physical exam.  Discharge Diagnoses: Term Pregnancy-delivered  Discharge Information: Date: 07/29/2012 Activity: pelvic rest Diet: routine Medications: PNV, Ibuprofen and Percocet Condition: stable Instructions: refer to practice specific booklet Discharge to: home Follow-up Information    Follow up with BOVARD,Carolos Fecher, MD. Schedule an appointment as soon as possible for a visit in 6 weeks.   Contact information:   [address] [place] [address] [phone]          Newborn Data: Live born female  Birth Weight: 7 lb 15.3 oz (3610 g) APGAR: 9, 9  Home with mother.  BOVARD,Shatana Saxton 07/29/2012, 9:30 AM

## 2012-08-29 ENCOUNTER — Encounter (HOSPITAL_COMMUNITY)
Admission: RE | Admit: 2012-08-29 | Discharge: 2012-08-29 | Disposition: A | Discharge status: Home / Self Care | Payer: Medicaid Other | Source: Ambulatory Visit | Attending: Obstetrics and Gynecology | Admitting: Obstetrics and Gynecology

## 2012-08-29 DIAGNOSIS — O923 Agalactia: Secondary | ICD-10-CM | POA: Insufficient documentation

## 2014-05-12 ENCOUNTER — Encounter (HOSPITAL_COMMUNITY): Payer: Self-pay | Admitting: Obstetrics and Gynecology

## 2015-04-25 ENCOUNTER — Emergency Department (HOSPITAL_COMMUNITY)
Admission: EM | Admit: 2015-04-25 | Discharge: 2015-04-25 | Disposition: A | Discharge status: Home / Self Care | Payer: BLUE CROSS/BLUE SHIELD | Attending: Emergency Medicine | Admitting: Emergency Medicine

## 2015-04-25 ENCOUNTER — Encounter (HOSPITAL_COMMUNITY): Payer: Self-pay

## 2015-04-25 ENCOUNTER — Emergency Department (HOSPITAL_COMMUNITY): Payer: BLUE CROSS/BLUE SHIELD

## 2015-04-25 DIAGNOSIS — Y9389 Activity, other specified: Secondary | ICD-10-CM | POA: Diagnosis not present

## 2015-04-25 DIAGNOSIS — Y9289 Other specified places as the place of occurrence of the external cause: Secondary | ICD-10-CM | POA: Insufficient documentation

## 2015-04-25 DIAGNOSIS — S93401A Sprain of unspecified ligament of right ankle, initial encounter: Secondary | ICD-10-CM

## 2015-04-25 DIAGNOSIS — Z8719 Personal history of other diseases of the digestive system: Secondary | ICD-10-CM | POA: Insufficient documentation

## 2015-04-25 DIAGNOSIS — X58XXXA Exposure to other specified factors, initial encounter: Secondary | ICD-10-CM | POA: Diagnosis not present

## 2015-04-25 DIAGNOSIS — Y999 Unspecified external cause status: Secondary | ICD-10-CM | POA: Diagnosis not present

## 2015-04-25 DIAGNOSIS — F329 Major depressive disorder, single episode, unspecified: Secondary | ICD-10-CM | POA: Insufficient documentation

## 2015-04-25 DIAGNOSIS — Z87440 Personal history of urinary (tract) infections: Secondary | ICD-10-CM | POA: Insufficient documentation

## 2015-04-25 DIAGNOSIS — S99911A Unspecified injury of right ankle, initial encounter: Secondary | ICD-10-CM | POA: Diagnosis present

## 2015-04-25 DIAGNOSIS — Z79899 Other long term (current) drug therapy: Secondary | ICD-10-CM | POA: Insufficient documentation

## 2015-04-25 DIAGNOSIS — F419 Anxiety disorder, unspecified: Secondary | ICD-10-CM | POA: Insufficient documentation

## 2015-04-25 MED ORDER — NAPROXEN 500 MG PO TABS: 500.0000 mg | ORAL_TABLET | Freq: Two times a day (BID) | ORAL | Status: DC

## 2015-04-25 MED ORDER — TRAMADOL HCL 50 MG PO TABS: 50.0000 mg | ORAL_TABLET | Freq: Four times a day (QID) | ORAL | Status: DC | PRN

## 2015-04-25 MED ORDER — HYDROCODONE-ACETAMINOPHEN 5-325 MG PO TABS
1.0000 | ORAL_TABLET | Freq: Once | ORAL | Status: AC
  Administered 2015-04-25: 1 via ORAL
  Filled 2015-04-25: qty 1

## 2015-04-25 NOTE — Discharge Instructions (Signed)
Ankle Sprain °An ankle sprain is an injury to the strong, fibrous tissues (ligaments) that hold the bones of your ankle joint together.  °CAUSES °An ankle sprain is usually caused by a fall or by twisting your ankle. Ankle sprains most commonly occur when you step on the outer edge of your foot, and your ankle turns inward. People who participate in sports are more prone to these types of injuries.  °SYMPTOMS  °· Pain in your ankle. The pain may be present at rest or only when you are trying to stand or walk. °· Swelling. °· Bruising. Bruising may develop immediately or within 1 to 2 days after your injury. °· Difficulty standing or walking, particularly when turning corners or changing directions. °DIAGNOSIS  °Your caregiver will ask you details about your injury and perform a physical exam of your ankle to determine if you have an ankle sprain. During the physical exam, your caregiver will press on and apply pressure to specific areas of your foot and ankle. Your caregiver will try to move your ankle in certain ways. An X-ray exam may be done to be sure a bone was not broken or a ligament did not separate from one of the bones in your ankle (avulsion fracture).  °TREATMENT  °Certain types of braces can help stabilize your ankle. Your caregiver can make a recommendation for this. Your caregiver may recommend the use of medicine for pain. If your sprain is severe, your caregiver may refer you to a surgeon who helps to restore function to parts of your skeletal system (orthopedist) or a physical therapist. °HOME CARE INSTRUCTIONS  °· Apply ice to your injury for 1-2 days or as directed by your caregiver. Applying ice helps to reduce inflammation and pain. °· Put ice in a plastic bag. °· Place a towel between your skin and the bag. °· Leave the ice on for 15-20 minutes at a time, every 2 hours while you are awake. °· Only take over-the-counter or prescription medicines for pain, discomfort, or fever as directed by  your caregiver. °· Elevate your injured ankle above the level of your heart as much as possible for 2-3 days. °· If your caregiver recommends crutches, use them as instructed. Gradually put weight on the affected ankle. Continue to use crutches or a cane until you can walk without feeling pain in your ankle. °· If you have a plaster splint, wear the splint as directed by your caregiver. Do not rest it on anything harder than a pillow for the first 24 hours. Do not put weight on it. Do not get it wet. You may take it off to take a shower or bath. °· You may have been given an elastic bandage to wear around your ankle to provide support. If the elastic bandage is too tight (you have numbness or tingling in your foot or your foot becomes cold and blue), adjust the bandage to make it comfortable. °· If you have an air splint, you may blow more air into it or let air out to make it more comfortable. You may take your splint off at night and before taking a shower or bath. Wiggle your toes in the splint several times per day to decrease swelling. °SEEK MEDICAL CARE IF:  °· You have rapidly increasing bruising or swelling. °· Your toes feel extremely cold or you lose feeling in your foot. °· Your pain is not relieved with medicine. °SEEK IMMEDIATE MEDICAL CARE IF: °· Your toes are numb or blue. °·   You have severe pain that is increasing. °MAKE SURE YOU:  °· Understand these instructions. °· Will watch your condition. °· Will get help right away if you are not doing well or get worse. °  °This information is not intended to replace advice given to you by your health care provider. Make sure you discuss any questions you have with your health care provider. °  °Document Released: 06/27/2005 Document Revised: 07/18/2014 Document Reviewed: 07/09/2011 °Elsevier Interactive Patient Education ©2016 [employer] ° °Cryotherapy °Cryotherapy means treatment with cold. Ice or gel packs can be used to reduce both pain and swelling.  Ice is the most helpful within the first 24 to 48 hours after an injury or flare-up from overusing a muscle or joint. Sprains, strains, spasms, burning pain, shooting pain, and aches can all be eased with ice. Ice can also be used when recovering from surgery. Ice is effective, has very few side effects, and is safe for most people to use. °PRECAUTIONS  °Ice is not a safe treatment option for people with: °· Raynaud phenomenon. This is a condition affecting small blood vessels in the extremities. Exposure to cold may cause your problems to return. °· Cold hypersensitivity. There are many forms of cold hypersensitivity, including: °¨ Cold urticaria. Red, itchy hives appear on the skin when the tissues begin to warm after being iced. °¨ Cold erythema. This is a red, itchy rash caused by exposure to cold. °¨ Cold hemoglobinuria. Red blood cells break down when the tissues begin to warm after being iced. The hemoglobin that carry oxygen are passed into the urine because they cannot combine with blood proteins fast enough. °· Numbness or altered sensitivity in the area being iced. °If you have any of the following conditions, do not use ice until you have discussed cryotherapy with your caregiver: °· Heart conditions, such as arrhythmia, angina, or chronic heart disease. °· High blood pressure. °· Healing wounds or open skin in the area being iced. °· Current infections. °· Rheumatoid arthritis. °· Poor circulation. °· Diabetes. °Ice slows the blood flow in the region it is applied. This is beneficial when trying to stop inflamed tissues from spreading irritating chemicals to surrounding tissues. However, if you expose your skin to cold temperatures for too long or without the proper protection, you can damage your skin or nerves. Watch for signs of skin damage due to cold. °HOME CARE INSTRUCTIONS °Follow these tips to use ice and cold packs safely. °· Place a dry or damp towel between the ice and skin. A damp towel will  cool the skin more quickly, so you may need to shorten the time that the ice is used. °· For a more rapid response, add gentle compression to the ice. °· Ice for no more than 10 to 20 minutes at a time. The bonier the area you are icing, the less time it will take to get the benefits of ice. °· Check your skin after 5 minutes to make sure there are no signs of a poor response to cold or skin damage. °· Rest 20 minutes or more between uses. °· Once your skin is numb, you can end your treatment. You can test numbness by very lightly touching your skin. The touch should be so light that you do not see the skin dimple from the pressure of your fingertip. When using ice, most people will feel these normal sensations in this order: cold, burning, aching, and numbness. °· Do not use ice on someone who   cannot communicate their responses to pain, such as small children or people with dementia. °HOW TO MAKE AN ICE PACK °Ice packs are the most common way to use ice therapy. Other methods include ice massage, ice baths, and cryosprays. Muscle creams that cause a cold, tingly feeling do not offer the same benefits that ice offers and should not be used as a substitute unless recommended by your caregiver. °To make an ice pack, do one of the following: °· Place crushed ice or a bag of frozen vegetables in a sealable plastic bag. Squeeze out the excess air. Place this bag inside another plastic bag. Slide the bag into a pillowcase or place a damp towel between your skin and the bag. °· Mix 3 parts water with 1 part rubbing alcohol. Freeze the mixture in a sealable plastic bag. When you remove the mixture from the freezer, it will be slushy. Squeeze out the excess air. Place this bag inside another plastic bag. Slide the bag into a pillowcase or place a damp towel between your skin and the bag. °SEEK MEDICAL CARE IF: °· You develop white spots on your skin. This may give the skin a blotchy (mottled) appearance. °· Your skin turns  blue or pale. °· Your skin becomes waxy or hard. °· Your swelling gets worse. °MAKE SURE YOU:  °· Understand these instructions. °· Will watch your condition. °· Will get help right away if you are not doing well or get worse. °  °This information is not intended to replace advice given to you by your health care provider. Make sure you discuss any questions you have with your health care provider. °  °Document Released: 02/21/2011 Document Revised: 07/18/2014 Document Reviewed: 02/21/2011 °Elsevier Interactive Patient Education ©2016 [employer] ° °

## 2015-04-25 NOTE — ED Provider Notes (Signed)
CSN: [id number]     Arrival date & time 04/25/15  1645 History  By signing my name below, I, Kelly Clay, attest that this documentation has been prepared under the direction and in the presence of Arthor CaptainAbigail Zubin Pontillo, PA-C. Electronically Signed: Lyndel SafeKaitlyn Clay, ED Scribe. 04/25/2015. 5:24 PM.   Chief Complaint  Patient presents with  . Ankle Pain   The history is provided by the patient and the spouse. No language interpreter was used.   HPI Comments: Kelly Clay is a 39 y.o. female who presents to the Emergency Department complaining of sudden onset, constant right ankle and foot pain with gradually worsening swelling of lateral right ankle and radiation of pain to anterior and posterior right lower leg s/p mechanical injury that occurred PTA. Pt reports while sliding down a steep slide earlier this afternoon her right foot was jammed into a hole at the bottom of the slide. She notes hearing and feeling a pop in right ankle at that time. Pt is unable to bear weight on her right foot and pain is exacerbated with touch to right foot and ankle. Pt has not taken any alleviating medication PTA.  She is unable to recall any prior injuries to right ankle. Denies wound to the affected area.    Past Medical History  Diagnosis Date  . Depression   . Anxiety   . Complication of anesthesia     epidurals did not take  . Preterm labor     induced at 36wks, PIH  . Urinary tract infection   . Abnormal Pap smear     colpo  . AMA (advanced maternal age) multigravida 35+   . H/O hiatal hernia   . WUJWJXBJ(478.2Headache(784.0)    Past Surgical History  Procedure Laterality Date  . Tonsillectomy    . Dilation and curettage of uterus    . Cesarean section  07/26/2012    Procedure: CESAREAN SECTION;  Surgeon: Lavina Hammanodd Meisinger, MD;  Location: [facility];  Service: Obstetrics;  Laterality: N/A;  Primary Cesarean Section Delivery Baby Girl @ [phone], Apgars 9/9   Family History  Problem Relation Age of Onset  . Other Neg  Hx   . Hypertension Mother   . COPD Mother   . Heart disease Mother   . Thyroid disease Mother   . Hypertension Father   . Diabetes Father   . Birth defects Son     clubbed feet  . Cancer Maternal Grandmother     breast   Social History  Substance Use Topics  . Smoking status: Never Smoker   . Smokeless tobacco: Never Used  . Alcohol Use: No     Comment: prior to preg   OB History    Gravida Para Term Preterm AB TAB SAB Ectopic Multiple Living   5 3 2 1 2 1 1  0 0 3     Review of Systems  Musculoskeletal: Positive for joint swelling ( right foot and ankle) and arthralgias ( right foot and ankle).  Skin: Negative for wound.   Allergies  Imitrex and Terbutaline  Home Medications   Prior to Admission medications   Medication Sig Start Date End Date Taking? Authorizing Provider  acetaminophen (TYLENOL) 325 MG tablet Take 650 mg by mouth every 8 (eight) hours as needed. For pain    Historical Provider, MD  diphenhydrAMINE (BENADRYL) 25 mg capsule Take 25 mg by mouth every 6 (six) hours as needed. For allergies    Historical Provider, MD  ibuprofen (ADVIL,MOTRIN) 800 MG  tablet Take 1 tablet (800 mg total) by mouth every 8 (eight) hours as needed. 07/29/12   Sherian Rein, MD  oxyCODONE-acetaminophen (PERCOCET/ROXICET) 5-325 MG per tablet Take 1-2 tablets by mouth every 6 (six) hours as needed (moderate - severe pain). 07/29/12   Jody Bovard-Stuckert, MD  pantoprazole (PROTONIX) 20 MG tablet Take 20 mg by mouth daily.    Historical Provider, MD  Prenatal Vit-Fe Fumarate-FA (PRENATAL MULTIVITAMIN) TABS Take 1 tablet by mouth daily.    Historical Provider, MD  Prenatal Vit-Fe Fumarate-FA (PRENATAL MULTIVITAMIN) TABS Take 1 tablet by mouth daily. 07/29/12   Jody Bovard-Stuckert, MD  sertraline (ZOLOFT) 100 MG tablet Take 100 mg by mouth daily.    Historical Provider, MD   BP 109/63 mmHg  Pulse 96  Temp(Src) 98 F (36.7 C)  Resp 18  Ht  (1.6 m)  Wt 174 lb (78.926 kg)   BMI 30.83 kg/m2  SpO2 100% Physical Exam  Constitutional: She is oriented to person, place, and time. She appears well-developed and well-nourished. No distress.  HENT:  Head: Normocephalic.  Eyes: Conjunctivae are normal.  Neck: Normal range of motion. Neck supple.  Cardiovascular: Normal rate.   Pulmonary/Chest: Effort normal. No respiratory distress.  Musculoskeletal: Normal range of motion. She exhibits edema and tenderness.  Predominantly swollen over lateral malleolus, negative Thompson's test, exquisitely tender throughout right ankle and foot and over medial 5th metatarsal, unable to fully participate in active ROM due to pain at this time, NVI.  Neurological: She is alert and oriented to person, place, and time. Coordination normal.  Skin: Skin is warm.  Psychiatric: She has a normal mood and affect. Her behavior is normal.  Nursing note and vitals reviewed.   ED Course  Procedures  DIAGNOSTIC STUDIES: Oxygen Saturation is 100% on RA, normal by my interpretation.    COORDINATION OF CARE: 5:12 PM Discussed treatment plan with pt at bedside and pt agreed to plan.  Imaging Review Dg Ankle Complete Right  04/25/2015  CLINICAL DATA:  Pt went down a slide and both feet went into a hole and got jammed up. Obvious swelling on lateral side of right ankle. Pt states pain is all over the right ankle. EXAM: RIGHT ANKLE - COMPLETE 3+ VIEW COMPARISON:  None. FINDINGS: No fracture. Ankle mortise is normally spaced and aligned. No arthropathic change. There is lateral soft tissue swelling. IMPRESSION: No fracture or dislocation. Electronically Signed   By: Amie Portland M.D.   On: 04/25/2015 17:36   I have personally reviewed and evaluated these images results as part of my medical decision-making.   MDM   Final diagnoses:  Ankle sprain, right, initial encounter    Patient X-Ray negative for obvious fracture or dislocation. Pain managed in ED. Pt advised to follow up with orthopedics  if symptoms persist for possibility of missed fracture diagnosis. Patient given brace while in ED, conservative therapy recommended and discussed. Patient will be dc home & is agreeable with above plan.   Lucila Maine, personally performed the services described in this documentation. All medical record entries made by the scribe were at my direction and in my presence.  I have reviewed the chart and discharge instructions and agree that the record reflects my personal performance and is accurate and complete. Arthor Captain.  04/25/2015. 6:02 PM.       Arthor Captain, PA-C 04/25/15 1803  Arby Barrette, MD 04/26/15 1610

## 2015-04-25 NOTE — ED Notes (Signed)
Rt. Ankle injury, sliding down a slide, heard the ankle pop. Swelling noted

## 2015-10-28 DIAGNOSIS — Z30430 Encounter for insertion of intrauterine contraceptive device: Secondary | ICD-10-CM | POA: Diagnosis not present

## 2015-10-28 DIAGNOSIS — Z3202 Encounter for pregnancy test, result negative: Secondary | ICD-10-CM | POA: Diagnosis not present

## 2015-11-04 DIAGNOSIS — L814 Other melanin hyperpigmentation: Secondary | ICD-10-CM | POA: Diagnosis not present

## 2015-11-04 DIAGNOSIS — L821 Other seborrheic keratosis: Secondary | ICD-10-CM | POA: Diagnosis not present

## 2015-11-04 DIAGNOSIS — L700 Acne vulgaris: Secondary | ICD-10-CM | POA: Diagnosis not present

## 2015-11-04 DIAGNOSIS — D225 Melanocytic nevi of trunk: Secondary | ICD-10-CM | POA: Diagnosis not present

## 2015-12-09 DIAGNOSIS — Z30431 Encounter for routine checking of intrauterine contraceptive device: Secondary | ICD-10-CM | POA: Diagnosis not present

## 2016-01-26 DIAGNOSIS — F419 Anxiety disorder, unspecified: Secondary | ICD-10-CM | POA: Diagnosis not present

## 2016-01-26 DIAGNOSIS — Z6832 Body mass index (BMI) 32.0-32.9, adult: Secondary | ICD-10-CM | POA: Diagnosis not present

## 2016-01-26 DIAGNOSIS — Z713 Dietary counseling and surveillance: Secondary | ICD-10-CM | POA: Diagnosis not present

## 2016-02-09 DIAGNOSIS — Z713 Dietary counseling and surveillance: Secondary | ICD-10-CM | POA: Diagnosis not present

## 2016-02-09 DIAGNOSIS — H6691 Otitis media, unspecified, right ear: Secondary | ICD-10-CM | POA: Diagnosis not present

## 2016-02-09 DIAGNOSIS — Z6833 Body mass index (BMI) 33.0-33.9, adult: Secondary | ICD-10-CM | POA: Diagnosis not present

## 2016-02-09 DIAGNOSIS — K5900 Constipation, unspecified: Secondary | ICD-10-CM | POA: Diagnosis not present

## 2016-03-15 DIAGNOSIS — F329 Major depressive disorder, single episode, unspecified: Secondary | ICD-10-CM | POA: Diagnosis not present

## 2016-03-15 DIAGNOSIS — Z6832 Body mass index (BMI) 32.0-32.9, adult: Secondary | ICD-10-CM | POA: Diagnosis not present

## 2016-03-15 DIAGNOSIS — Z713 Dietary counseling and surveillance: Secondary | ICD-10-CM | POA: Diagnosis not present

## 2016-04-01 DIAGNOSIS — J069 Acute upper respiratory infection, unspecified: Secondary | ICD-10-CM | POA: Diagnosis not present

## 2016-04-01 DIAGNOSIS — H93293 Other abnormal auditory perceptions, bilateral: Secondary | ICD-10-CM | POA: Diagnosis not present

## 2016-04-01 DIAGNOSIS — H9203 Otalgia, bilateral: Secondary | ICD-10-CM | POA: Diagnosis not present

## 2016-04-01 DIAGNOSIS — H9193 Unspecified hearing loss, bilateral: Secondary | ICD-10-CM | POA: Insufficient documentation

## 2016-04-04 DIAGNOSIS — J019 Acute sinusitis, unspecified: Secondary | ICD-10-CM | POA: Diagnosis not present

## 2016-04-06 DIAGNOSIS — J069 Acute upper respiratory infection, unspecified: Secondary | ICD-10-CM | POA: Diagnosis not present

## 2016-04-14 DIAGNOSIS — Z23 Encounter for immunization: Secondary | ICD-10-CM | POA: Diagnosis not present

## 2016-04-14 DIAGNOSIS — Z713 Dietary counseling and surveillance: Secondary | ICD-10-CM | POA: Diagnosis not present

## 2016-04-14 DIAGNOSIS — F419 Anxiety disorder, unspecified: Secondary | ICD-10-CM | POA: Diagnosis not present

## 2016-04-14 DIAGNOSIS — Z6832 Body mass index (BMI) 32.0-32.9, adult: Secondary | ICD-10-CM | POA: Diagnosis not present

## 2016-08-01 DIAGNOSIS — J0100 Acute maxillary sinusitis, unspecified: Secondary | ICD-10-CM | POA: Diagnosis not present

## 2016-08-09 DIAGNOSIS — F321 Major depressive disorder, single episode, moderate: Secondary | ICD-10-CM | POA: Diagnosis not present

## 2016-08-09 DIAGNOSIS — M5431 Sciatica, right side: Secondary | ICD-10-CM | POA: Diagnosis not present

## 2016-08-10 DIAGNOSIS — Z130 Encounter for screening for diseases of the blood and blood-forming organs and certain disorders involving the immune mechanism: Secondary | ICD-10-CM | POA: Diagnosis not present

## 2016-08-10 DIAGNOSIS — Z124 Encounter for screening for malignant neoplasm of cervix: Secondary | ICD-10-CM | POA: Diagnosis not present

## 2016-08-10 DIAGNOSIS — N898 Other specified noninflammatory disorders of vagina: Secondary | ICD-10-CM | POA: Diagnosis not present

## 2016-08-10 DIAGNOSIS — Z1389 Encounter for screening for other disorder: Secondary | ICD-10-CM | POA: Diagnosis not present

## 2016-08-10 DIAGNOSIS — Z1151 Encounter for screening for human papillomavirus (HPV): Secondary | ICD-10-CM | POA: Diagnosis not present

## 2016-08-10 DIAGNOSIS — Z113 Encounter for screening for infections with a predominantly sexual mode of transmission: Secondary | ICD-10-CM | POA: Diagnosis not present

## 2016-08-10 DIAGNOSIS — Z6833 Body mass index (BMI) 33.0-33.9, adult: Secondary | ICD-10-CM | POA: Diagnosis not present

## 2016-08-10 DIAGNOSIS — Z30431 Encounter for routine checking of intrauterine contraceptive device: Secondary | ICD-10-CM | POA: Diagnosis not present

## 2016-08-10 DIAGNOSIS — Z01419 Encounter for gynecological examination (general) (routine) without abnormal findings: Secondary | ICD-10-CM | POA: Diagnosis not present

## 2016-08-11 DIAGNOSIS — Z124 Encounter for screening for malignant neoplasm of cervix: Secondary | ICD-10-CM | POA: Diagnosis not present

## 2016-08-16 ENCOUNTER — Ambulatory Visit (INDEPENDENT_AMBULATORY_CARE_PROVIDER_SITE_OTHER): Payer: BLUE CROSS/BLUE SHIELD | Admitting: Physician Assistant

## 2016-08-16 VITALS — BP 120/78 | HR 98 | Temp 97.8°F | Resp 16 | Ht 63.0 in | Wt 184.8 lb

## 2016-08-16 DIAGNOSIS — R110 Nausea: Secondary | ICD-10-CM | POA: Diagnosis not present

## 2016-08-16 DIAGNOSIS — H66004 Acute suppurative otitis media without spontaneous rupture of ear drum, recurrent, right ear: Secondary | ICD-10-CM | POA: Diagnosis not present

## 2016-08-16 DIAGNOSIS — H9193 Unspecified hearing loss, bilateral: Secondary | ICD-10-CM

## 2016-08-16 DIAGNOSIS — S060X0A Concussion without loss of consciousness, initial encounter: Secondary | ICD-10-CM | POA: Diagnosis not present

## 2016-08-16 DIAGNOSIS — S0990XA Unspecified injury of head, initial encounter: Secondary | ICD-10-CM | POA: Diagnosis not present

## 2016-08-16 DIAGNOSIS — F419 Anxiety disorder, unspecified: Secondary | ICD-10-CM | POA: Insufficient documentation

## 2016-08-16 MED ORDER — ONDANSETRON 4 MG PO TBDP: 4.0000 mg | ORAL_TABLET | Freq: Three times a day (TID) | ORAL | 0 refills | Status: DC | PRN

## 2016-08-16 MED ORDER — AMOXICILLIN 875 MG PO TABS: 875.0000 mg | ORAL_TABLET | Freq: Two times a day (BID) | ORAL | 0 refills | Status: DC
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
## 2016-08-16 NOTE — Patient Instructions (Addendum)
Concussion, Adult A concussion is a brain injury. It is caused by:  A hit to the head.  A quick and sudden movement (jolt) of the head or neck. A concussion is usually not life threatening. Even so, it can cause serious problems. If you had a concussion before, you may have concussion-like problems after a hit to your head. Follow these instructions at home: General instructions  Follow your doctor's directions carefully.  Take medicines only as told by your doctor.  Only take medicines your doctor says are safe.  Do not drink alcohol until your doctor says it is okay. Alcohol and some drugs can slow down healing. They can also put you at risk for further injury.  If you are having trouble remembering things, write them down.  Try to do one thing at a time if you get distracted easily. For example, do not watch TV while making dinner.  Talk to your family members or close friends when making important decisions.  Follow up with your doctor as told.  Watch your symptoms. Tell others to do the same. Serious problems can sometimes happen after a concussion. Older adults are more likely to have these problems.  Tell your teachers, school nurse, school counselor, coach, Event organiserathletic trainer, or work Production designer, theatre/television/filmmanager about your concussion. Tell them about what you can or cannot do. They should watch to see if:  It gets even harder for you to pay attention or concentrate.  It gets even harder for you to remember things or learn new things.  You need more time than normal to finish things.  You become annoyed (irritable) more than before.  You are not able to deal with stress as well.  You have more problems than before.  Rest. Make sure you:  Get plenty of sleep at night.  Go to sleep early.  Go to bed at the same time every day. Try to wake up at the same time.  Rest during the day.  Take naps when you feel tired.  Limit activities where you have to think a lot or concentrate.  These include:  Doing homework.  Doing work related to a job.  Watching TV.  Using the computer. Returning To Your Regular Activities  Return to your normal activities slowly, not all at once. You must give your body and brain enough time to heal.  Do not play sports or do other athletic activities until your doctor says it is okay.  Ask your doctor when you can drive, ride a bicycle, or work other vehicles or machines. Never do these things if you feel dizzy.  Ask your doctor about when you can return to work or school. Preventing Another Concussion  It is very important to avoid another brain injury, especially before you have healed. In rare cases, another injury can lead to permanent brain damage, brain swelling, or death. The risk of this is greatest during the first 7-10 days after your injury. Avoid injuries by:  Wearing a seat belt when riding in a car.  Not drinking too much alcohol.  Avoiding activities that could lead to a second concussion (such as contact sports).  Wearing a helmet when doing activities like:  Biking.  Skiing.  Skateboarding.  Skating.  Making your home safer by:  Removing things from the floor or stairways that could make you trip.  Using grab bars in bathrooms and handrails by stairs.  Placing non-slip mats on floors and in bathtubs.  Improve lighting in dark areas.  Contact a doctor if:  It gets even harder for you to pay attention or concentrate.  It gets even harder for you to remember things or learn new things.  You need more time than normal to finish things.  You become annoyed (irritable) more than before.  You are not able to deal with stress as well.  You have more problems than before.  You have problems keeping your balance.  You are not able to react quickly when you should. Get help if you have any of these problems for more than 2 weeks:  Lasting (chronic) headaches.  Dizziness or trouble  balancing.  Feeling sick to your stomach (nausea).  Seeing (vision) problems.  Being affected by noises or light more than normal.  Feeling sad, low, down in the dumps, blue, gloomy, or empty (depressed).  Mood changes (mood swings).  Feeling of fear or nervousness about what may happen (anxiety).  Feeling annoyed.  Memory problems.  Problems concentrating or paying attention.  Sleep problems.  Feeling tired all the time. Get help right away if:  You have bad headaches or your headaches get worse.  You have weakness (even if it is in one hand, leg, or part of the face).  You have loss of feeling (numbness).  You feel off balance.  You keep throwing up (vomiting).  You feel tired.  One black center of your eye (pupil) is larger than the other.  You twitch or shake violently (convulse).  Your speech is not clear (slurred).  You are more confused, easily angered (agitated), or annoyed than before.  You have more trouble resting than before.  You are unable to recognize people or places.  You have neck pain.  It is difficult to wake you up.  You have unusual behavior changes.  You pass out (lose consciousness). This information is not intended to replace advice given to you by your health care provider. Make sure you discuss any questions you have with your health care provider. Document Released: 06/15/2009 Document Revised: 12/03/2015 Document Reviewed: 01/17/2013 Elsevier Interactive Patient Education  2017 [employer].    IF you received an x-ray today, you will receive an invoice from [facility]. Please contact [facility] at [phone] with questions or concerns regarding your invoice.   IF you received labwork today, you will receive an invoice from [facility]. Please contact [facility] at [phone] with questions or concerns regarding your invoice.   Our billing staff will not be able to assist you with questions regarding  bills from these companies.  You will be contacted with the lab results as soon as they are available. The fastest way to get your results is to activate your My Chart account. Instructions are located on the last page of this paperwork. If you have not heard from [place] regarding the results in 2 weeks, please contact this office.

## 2016-08-18 ENCOUNTER — Ambulatory Visit (INDEPENDENT_AMBULATORY_CARE_PROVIDER_SITE_OTHER): Payer: BLUE CROSS/BLUE SHIELD | Admitting: Physician Assistant

## 2016-08-18 ENCOUNTER — Ambulatory Visit: Payer: BLUE CROSS/BLUE SHIELD | Admitting: Physician Assistant

## 2016-08-18 ENCOUNTER — Encounter: Payer: Self-pay | Admitting: Physician Assistant

## 2016-08-18 VITALS — BP 116/72 | HR 76 | Temp 99.1°F | Resp 18 | Ht 63.0 in | Wt 182.0 lb

## 2016-08-18 DIAGNOSIS — S060X0D Concussion without loss of consciousness, subsequent encounter: Secondary | ICD-10-CM | POA: Diagnosis not present

## 2016-08-18 DIAGNOSIS — S0990XD Unspecified injury of head, subsequent encounter: Secondary | ICD-10-CM

## 2016-08-18 NOTE — Progress Notes (Signed)
Kelly Clay  MRN: [id number] DOB: 1976-01-19  Subjective:  Pt presents to clinic for recheck of concussion.  She has been trying and doing brain rest and she has been pretty successful.  She has not had the TV on and has not done any work from home.  She is only slightly better with her symptoms.  Her headache is slightly better - it does worsen after she has to focus and think about something.  Her concentration is still lower than normal and she finds that she is having trouble th her hand eye coordination and slower with responses that cause her to have to answer questions or follow commands.  She is still really tired and she had some mild dizziness with change in positions and mild nausea.  She does not feel confused but her children have noticed that she is responding slower than normal.  She has since developed GI illness with mild diarrhea and low grade fever and upset stomach today and just does not feel good as well as cough and mild cold symptoms.  Review of Systems  Constitutional: Negative for chills and fever.  HENT: Positive for congestion (mild).   Gastrointestinal: Positive for diarrhea (mild) and nausea (improved - zofran is helping).  Musculoskeletal: Negative for back pain and neck pain.  Neurological: Positive for dizziness (improved) and headaches (improved).  Psychiatric/Behavioral: Negative for confusion.    Patient Active Problem List   Diagnosis Date Noted  . Anxiety 08/16/2016  . 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 Abnormal auditory perception of both ears 04/01/2016    Current Outpatient Prescriptions on File Prior to Visit  Medication Sig Dispense Refill  . amoxicillin (AMOXIL) 875 MG tablet Take 1 tablet (875 mg total) by mouth 2 (two) times daily. 20 tablet 0  . FLUoxetine (PROZAC) 10 MG tablet Take 10 mg by mouth daily.    Marland Kitchen levonorgestrel (MIRENA) 20 MCG/24HR IUD 1 each by Intrauterine route once.    . Liraglutide -Weight Management  (SAXENDA [place]) Inject 1.2 mg into the skin daily.    Marland Kitchen LORazepam (ATIVAN) 1 MG tablet Take 1 mg by mouth every 8 (eight) hours.    Marland Kitchen lurasidone (LATUDA) 40 MG TABS tablet Take 40 mg by mouth daily with breakfast.    . ondansetron (ZOFRAN ODT) 4 MG disintegrating tablet Take 1 tablet (4 mg total) by mouth every 8 (eight) hours as needed for nausea. 20 tablet 0  . traZODone (DESYREL) 100 MG tablet Take 100 mg by mouth at bedtime.     No current facility-administered medications on file prior to visit.     Allergies  Allergen Reactions  . Imitrex [Sumatriptan] Anaphylaxis, Swelling and Other (See Comments)    In throat. Numbness/tingling in body  . Terbutaline     hypotension    Pt patients past, family and social history were reviewed and updated.   Objective:  BP 116/72 (BP Location: Right Arm, Patient Position: Sitting, Cuff Size: Small)   Pulse 76   Temp 99.1 F (37.3 C) (Oral)   Resp 18   Ht 5\' 3"  (1.6 m)   Wt 182 lb (82.6 kg)   SpO2 97%   BMI 32.24 kg/m   Physical Exam  Constitutional: She is oriented to person, place, and time and well-developed, well-nourished, and in no distress.  HENT:  Head: Normocephalic and atraumatic.  Right Ear: Hearing and external ear normal.  Left Ear: Hearing and external ear normal.  Eyes: Conjunctivae are  normal.  Neck: Normal range of motion.  Cardiovascular: Normal rate, regular rhythm and normal heart sounds.   No murmur heard. Pulmonary/Chest: Effort normal and breath sounds normal. She has no wheezes.  Musculoskeletal:       Cervical back: Normal.  Neurological: She is alert and oriented to person, place, and time. She has normal sensation, normal strength, normal reflexes and intact cranial nerves. She displays no weakness, facial symmetry, normal speech and normal reflexes. No cranial nerve deficit or sensory deficit. She has an abnormal Romberg Test (unsteady but able to stay upright by herself). She has a normal Straight Leg Raise  Test, a normal Cerebellar Exam and a normal [facility]el to [facility]. Gait normal. Coordination (slow finger to nose testing) abnormal. Gait normal.  Decreased hand eye coordination.  Slower to follow directions that expected for age.  Skin: Skin is warm and dry.  Psychiatric: Mood, memory, affect and judgment normal.  Vitals reviewed.   Assessment and Plan :  Concussion without loss of consciousness, subsequent encounter  Injury of head, subsequent encounter - continue brain rest - she is improved overall though still feels poorly - she is a little frustrated that she is not getting better faster.  Note was written for work as she needs to be out for 24h after her symptoms resolve.  She will recheck with me Tuesday if she is not improving and Thursday if she is not well - of course she should be evaluated immediately if her symptoms worsen.  When she starts to feel better she can start to do small amount of work but she should stop if she develops worsening headache or symptoms start to return.  Ok to continue tylenol for the headache.  Benny LennertSarah Braxden Lovering PA-C  [facility] at [facility] 08/18/2016 5:17 PM

## 2016-08-18 NOTE — Patient Instructions (Addendum)
  Tues - recheck with me if you are not improving and absolutely if you are getting worse  Thursday - recheck with me if you are not back to normal  Ok to go to work when your symptoms have been gone for 24h - if you are feeling well you can start to do some things that require thought but please stop if your symptoms return    IF you received an x-ray today, you will receive an invoice from [facility]. Please contact [facility] at [phone] with questions or concerns regarding your invoice.   IF you received labwork today, you will receive an invoice from [facility]. Please contact [facility] at [phone] with questions or concerns regarding your invoice.   Our billing staff will not be able to assist you with questions regarding bills from these companies.  You will be contacted with the lab results as soon as they are available. The fastest way to get your results is to activate your My Chart account. Instructions are located on the last page of this paperwork. If you have not heard from us regarding the results in 2 weeks, please contact this office.

## 2016-09-20 DIAGNOSIS — H9203 Otalgia, bilateral: Secondary | ICD-10-CM | POA: Diagnosis not present

## 2016-11-18 DIAGNOSIS — D2271 Melanocytic nevi of right lower limb, including hip: Secondary | ICD-10-CM | POA: Diagnosis not present

## 2016-11-18 DIAGNOSIS — D2272 Melanocytic nevi of left lower limb, including hip: Secondary | ICD-10-CM | POA: Diagnosis not present

## 2016-11-18 DIAGNOSIS — D225 Melanocytic nevi of trunk: Secondary | ICD-10-CM | POA: Diagnosis not present

## 2016-11-18 DIAGNOSIS — L738 Other specified follicular disorders: Secondary | ICD-10-CM | POA: Diagnosis not present

## 2016-11-29 DIAGNOSIS — F419 Anxiety disorder, unspecified: Secondary | ICD-10-CM | POA: Diagnosis not present

## 2016-11-29 DIAGNOSIS — Z6832 Body mass index (BMI) 32.0-32.9, adult: Secondary | ICD-10-CM | POA: Diagnosis not present

## 2016-11-29 DIAGNOSIS — Z713 Dietary counseling and surveillance: Secondary | ICD-10-CM | POA: Diagnosis not present

## 2016-12-08 DIAGNOSIS — H9203 Otalgia, bilateral: Secondary | ICD-10-CM | POA: Diagnosis not present

## 2016-12-08 DIAGNOSIS — H8109 Meniere's disease, unspecified ear: Secondary | ICD-10-CM | POA: Diagnosis not present

## 2016-12-29 DIAGNOSIS — F321 Major depressive disorder, single episode, moderate: Secondary | ICD-10-CM | POA: Diagnosis not present

## 2016-12-29 DIAGNOSIS — Z6832 Body mass index (BMI) 32.0-32.9, adult: Secondary | ICD-10-CM | POA: Diagnosis not present

## 2016-12-29 DIAGNOSIS — F419 Anxiety disorder, unspecified: Secondary | ICD-10-CM | POA: Diagnosis not present

## 2017-02-28 DIAGNOSIS — Z6834 Body mass index (BMI) 34.0-34.9, adult: Secondary | ICD-10-CM | POA: Diagnosis not present

## 2017-02-28 DIAGNOSIS — Z713 Dietary counseling and surveillance: Secondary | ICD-10-CM | POA: Diagnosis not present

## 2017-03-27 DIAGNOSIS — F4310 Post-traumatic stress disorder, unspecified: Secondary | ICD-10-CM | POA: Diagnosis not present

## 2017-03-27 DIAGNOSIS — F411 Generalized anxiety disorder: Secondary | ICD-10-CM | POA: Diagnosis not present

## 2017-03-27 DIAGNOSIS — F332 Major depressive disorder, recurrent severe without psychotic features: Secondary | ICD-10-CM | POA: Diagnosis not present

## 2017-04-06 DIAGNOSIS — Z6835 Body mass index (BMI) 35.0-35.9, adult: Secondary | ICD-10-CM | POA: Diagnosis not present

## 2017-04-06 DIAGNOSIS — Z713 Dietary counseling and surveillance: Secondary | ICD-10-CM | POA: Diagnosis not present

## 2017-04-06 DIAGNOSIS — Z23 Encounter for immunization: Secondary | ICD-10-CM | POA: Diagnosis not present

## 2017-04-06 DIAGNOSIS — Z7189 Other specified counseling: Secondary | ICD-10-CM | POA: Diagnosis not present

## 2017-04-17 DIAGNOSIS — F4310 Post-traumatic stress disorder, unspecified: Secondary | ICD-10-CM | POA: Diagnosis not present

## 2017-04-17 DIAGNOSIS — F332 Major depressive disorder, recurrent severe without psychotic features: Secondary | ICD-10-CM | POA: Diagnosis not present

## 2017-04-17 DIAGNOSIS — F411 Generalized anxiety disorder: Secondary | ICD-10-CM | POA: Diagnosis not present

## 2017-04-27 DIAGNOSIS — F4312 Post-traumatic stress disorder, chronic: Secondary | ICD-10-CM | POA: Diagnosis not present

## 2017-04-28 DIAGNOSIS — F411 Generalized anxiety disorder: Secondary | ICD-10-CM | POA: Diagnosis not present

## 2017-04-28 DIAGNOSIS — F321 Major depressive disorder, single episode, moderate: Secondary | ICD-10-CM | POA: Diagnosis not present

## 2017-04-28 DIAGNOSIS — Z713 Dietary counseling and surveillance: Secondary | ICD-10-CM | POA: Diagnosis not present

## 2017-04-28 DIAGNOSIS — R41840 Attention and concentration deficit: Secondary | ICD-10-CM | POA: Diagnosis not present

## 2017-05-01 DIAGNOSIS — F332 Major depressive disorder, recurrent severe without psychotic features: Secondary | ICD-10-CM | POA: Diagnosis not present

## 2017-05-01 DIAGNOSIS — F4310 Post-traumatic stress disorder, unspecified: Secondary | ICD-10-CM | POA: Diagnosis not present

## 2017-05-01 DIAGNOSIS — F411 Generalized anxiety disorder: Secondary | ICD-10-CM | POA: Diagnosis not present

## 2017-05-09 DIAGNOSIS — J069 Acute upper respiratory infection, unspecified: Secondary | ICD-10-CM | POA: Diagnosis not present

## 2017-05-09 DIAGNOSIS — Z6834 Body mass index (BMI) 34.0-34.9, adult: Secondary | ICD-10-CM | POA: Diagnosis not present

## 2017-05-18 DIAGNOSIS — F4312 Post-traumatic stress disorder, chronic: Secondary | ICD-10-CM | POA: Diagnosis not present

## 2017-06-05 DIAGNOSIS — F332 Major depressive disorder, recurrent severe without psychotic features: Secondary | ICD-10-CM | POA: Diagnosis not present

## 2017-06-05 DIAGNOSIS — F4310 Post-traumatic stress disorder, unspecified: Secondary | ICD-10-CM | POA: Diagnosis not present

## 2017-06-05 DIAGNOSIS — F411 Generalized anxiety disorder: Secondary | ICD-10-CM | POA: Diagnosis not present

## 2017-06-13 DIAGNOSIS — F4312 Post-traumatic stress disorder, chronic: Secondary | ICD-10-CM | POA: Diagnosis not present

## 2017-06-25 DIAGNOSIS — J0100 Acute maxillary sinusitis, unspecified: Secondary | ICD-10-CM | POA: Diagnosis not present

## 2017-07-06 DIAGNOSIS — F4312 Post-traumatic stress disorder, chronic: Secondary | ICD-10-CM | POA: Diagnosis not present

## 2017-08-22 DIAGNOSIS — N3000 Acute cystitis without hematuria: Secondary | ICD-10-CM | POA: Diagnosis not present

## 2017-09-05 DIAGNOSIS — F4310 Post-traumatic stress disorder, unspecified: Secondary | ICD-10-CM | POA: Diagnosis not present

## 2017-09-05 DIAGNOSIS — F902 Attention-deficit hyperactivity disorder, combined type: Secondary | ICD-10-CM | POA: Diagnosis not present

## 2017-09-05 DIAGNOSIS — F332 Major depressive disorder, recurrent severe without psychotic features: Secondary | ICD-10-CM | POA: Diagnosis not present

## 2017-09-05 DIAGNOSIS — F411 Generalized anxiety disorder: Secondary | ICD-10-CM | POA: Diagnosis not present

## 2017-09-05 DIAGNOSIS — Z79899 Other long term (current) drug therapy: Secondary | ICD-10-CM | POA: Diagnosis not present

## 2017-09-13 DIAGNOSIS — Z130 Encounter for screening for diseases of the blood and blood-forming organs and certain disorders involving the immune mechanism: Secondary | ICD-10-CM | POA: Diagnosis not present

## 2017-09-13 DIAGNOSIS — Z113 Encounter for screening for infections with a predominantly sexual mode of transmission: Secondary | ICD-10-CM | POA: Diagnosis not present

## 2017-09-13 DIAGNOSIS — Z6835 Body mass index (BMI) 35.0-35.9, adult: Secondary | ICD-10-CM | POA: Diagnosis not present

## 2017-09-13 DIAGNOSIS — Z1231 Encounter for screening mammogram for malignant neoplasm of breast: Secondary | ICD-10-CM | POA: Diagnosis not present

## 2017-09-13 DIAGNOSIS — Z1389 Encounter for screening for other disorder: Secondary | ICD-10-CM | POA: Diagnosis not present

## 2017-09-13 DIAGNOSIS — Z01419 Encounter for gynecological examination (general) (routine) without abnormal findings: Secondary | ICD-10-CM | POA: Diagnosis not present

## 2017-09-14 DIAGNOSIS — Z113 Encounter for screening for infections with a predominantly sexual mode of transmission: Secondary | ICD-10-CM | POA: Diagnosis not present

## 2017-09-18 ENCOUNTER — Other Ambulatory Visit: Payer: Self-pay | Admitting: Obstetrics and Gynecology

## 2017-09-18 DIAGNOSIS — R928 Other abnormal and inconclusive findings on diagnostic imaging of breast: Secondary | ICD-10-CM

## 2017-09-20 ENCOUNTER — Ambulatory Visit: Payer: BLUE CROSS/BLUE SHIELD

## 2017-09-20 ENCOUNTER — Ambulatory Visit
Admission: RE | Admit: 2017-09-20 | Discharge: 2017-09-20 | Disposition: A | Discharge status: Home / Self Care | Payer: BLUE CROSS/BLUE SHIELD | Source: Ambulatory Visit | Attending: Obstetrics and Gynecology | Admitting: Obstetrics and Gynecology

## 2017-09-20 DIAGNOSIS — R928 Other abnormal and inconclusive findings on diagnostic imaging of breast: Secondary | ICD-10-CM | POA: Diagnosis not present

## 2017-10-04 DIAGNOSIS — N644 Mastodynia: Secondary | ICD-10-CM | POA: Diagnosis not present

## 2017-10-19 DIAGNOSIS — F332 Major depressive disorder, recurrent severe without psychotic features: Secondary | ICD-10-CM | POA: Diagnosis not present

## 2017-10-19 DIAGNOSIS — F411 Generalized anxiety disorder: Secondary | ICD-10-CM | POA: Diagnosis not present

## 2017-10-19 DIAGNOSIS — F4310 Post-traumatic stress disorder, unspecified: Secondary | ICD-10-CM | POA: Diagnosis not present

## 2018-01-01 DIAGNOSIS — F411 Generalized anxiety disorder: Secondary | ICD-10-CM | POA: Diagnosis not present

## 2018-01-01 DIAGNOSIS — F4310 Post-traumatic stress disorder, unspecified: Secondary | ICD-10-CM | POA: Diagnosis not present

## 2018-01-01 DIAGNOSIS — F332 Major depressive disorder, recurrent severe without psychotic features: Secondary | ICD-10-CM | POA: Diagnosis not present

## 2018-01-02 DIAGNOSIS — J018 Other acute sinusitis: Secondary | ICD-10-CM | POA: Diagnosis not present

## 2018-01-15 DIAGNOSIS — R05 Cough: Secondary | ICD-10-CM | POA: Diagnosis not present

## 2018-01-15 DIAGNOSIS — J029 Acute pharyngitis, unspecified: Secondary | ICD-10-CM | POA: Diagnosis not present

## 2018-01-15 DIAGNOSIS — R5383 Other fatigue: Secondary | ICD-10-CM | POA: Diagnosis not present

## 2018-02-01 DIAGNOSIS — F4310 Post-traumatic stress disorder, unspecified: Secondary | ICD-10-CM | POA: Diagnosis not present

## 2018-02-01 DIAGNOSIS — F411 Generalized anxiety disorder: Secondary | ICD-10-CM | POA: Diagnosis not present

## 2018-02-01 DIAGNOSIS — F332 Major depressive disorder, recurrent severe without psychotic features: Secondary | ICD-10-CM | POA: Diagnosis not present

## 2018-02-07 DIAGNOSIS — R45851 Suicidal ideations: Secondary | ICD-10-CM | POA: Diagnosis not present

## 2018-02-07 DIAGNOSIS — F329 Major depressive disorder, single episode, unspecified: Secondary | ICD-10-CM | POA: Diagnosis not present

## 2018-05-15 DIAGNOSIS — F332 Major depressive disorder, recurrent severe without psychotic features: Secondary | ICD-10-CM | POA: Diagnosis not present

## 2018-05-15 DIAGNOSIS — F4310 Post-traumatic stress disorder, unspecified: Secondary | ICD-10-CM | POA: Diagnosis not present

## 2018-05-15 DIAGNOSIS — F411 Generalized anxiety disorder: Secondary | ICD-10-CM | POA: Diagnosis not present

## 2018-05-24 DIAGNOSIS — J018 Other acute sinusitis: Secondary | ICD-10-CM | POA: Diagnosis not present

## 2018-06-12 DIAGNOSIS — N760 Acute vaginitis: Secondary | ICD-10-CM | POA: Diagnosis not present

## 2018-09-03 DIAGNOSIS — J069 Acute upper respiratory infection, unspecified: Secondary | ICD-10-CM | POA: Diagnosis not present

## 2018-09-03 DIAGNOSIS — R509 Fever, unspecified: Secondary | ICD-10-CM | POA: Diagnosis not present

## 2018-09-06 DIAGNOSIS — F411 Generalized anxiety disorder: Secondary | ICD-10-CM | POA: Diagnosis not present

## 2018-09-06 DIAGNOSIS — F332 Major depressive disorder, recurrent severe without psychotic features: Secondary | ICD-10-CM | POA: Diagnosis not present

## 2018-09-06 DIAGNOSIS — F4310 Post-traumatic stress disorder, unspecified: Secondary | ICD-10-CM | POA: Diagnosis not present

## 2018-09-18 DIAGNOSIS — J0100 Acute maxillary sinusitis, unspecified: Secondary | ICD-10-CM | POA: Diagnosis not present

## 2018-09-18 DIAGNOSIS — J209 Acute bronchitis, unspecified: Secondary | ICD-10-CM | POA: Diagnosis not present

## 2018-09-18 DIAGNOSIS — B379 Candidiasis, unspecified: Secondary | ICD-10-CM | POA: Diagnosis not present

## 2018-10-15 DIAGNOSIS — J0100 Acute maxillary sinusitis, unspecified: Secondary | ICD-10-CM | POA: Diagnosis not present

## 2018-10-15 DIAGNOSIS — R509 Fever, unspecified: Secondary | ICD-10-CM | POA: Diagnosis not present

## 2018-10-15 DIAGNOSIS — H9209 Otalgia, unspecified ear: Secondary | ICD-10-CM | POA: Diagnosis not present

## 2018-10-16 ENCOUNTER — Emergency Department (HOSPITAL_COMMUNITY)
Admission: EM | Admit: 2018-10-16 | Discharge: 2018-10-16 | Disposition: A | Discharge status: Home / Self Care | Payer: BLUE CROSS/BLUE SHIELD | Attending: Emergency Medicine | Admitting: Emergency Medicine

## 2018-10-16 ENCOUNTER — Encounter (HOSPITAL_COMMUNITY): Payer: Self-pay

## 2018-10-16 ENCOUNTER — Other Ambulatory Visit: Payer: Self-pay

## 2018-10-16 ENCOUNTER — Emergency Department (HOSPITAL_COMMUNITY): Payer: BLUE CROSS/BLUE SHIELD

## 2018-10-16 ENCOUNTER — Telehealth: Payer: BLUE CROSS/BLUE SHIELD | Admitting: Nurse Practitioner

## 2018-10-16 DIAGNOSIS — R05 Cough: Secondary | ICD-10-CM | POA: Insufficient documentation

## 2018-10-16 DIAGNOSIS — R509 Fever, unspecified: Secondary | ICD-10-CM | POA: Diagnosis not present

## 2018-10-16 DIAGNOSIS — R0602 Shortness of breath: Secondary | ICD-10-CM

## 2018-10-16 DIAGNOSIS — R6889 Other general symptoms and signs: Principal | ICD-10-CM

## 2018-10-16 DIAGNOSIS — R059 Cough, unspecified: Secondary | ICD-10-CM

## 2018-10-16 DIAGNOSIS — R000 Tachycardia, unspecified: Secondary | ICD-10-CM | POA: Diagnosis not present

## 2018-10-16 DIAGNOSIS — Z20822 Contact with and (suspected) exposure to covid-19: Secondary | ICD-10-CM

## 2018-10-16 DIAGNOSIS — R079 Chest pain, unspecified: Secondary | ICD-10-CM | POA: Diagnosis not present

## 2018-10-16 DIAGNOSIS — Z79899 Other long term (current) drug therapy: Secondary | ICD-10-CM | POA: Diagnosis not present

## 2018-10-16 LAB — COMPREHENSIVE METABOLIC PANEL
ALT: 21 U/L (ref 0–44)
AST: 43 U/L — ABNORMAL HIGH (ref 15–41)
Albumin: 4.2 g/dL (ref 3.5–5.0)
Alkaline Phosphatase: 30 U/L — ABNORMAL LOW (ref 38–126)
Anion gap: 6 (ref 5–15)
BUN: 9 mg/dL (ref 6–20)
CO2: 25 mmol/L (ref 22–32)
Calcium: 9.2 mg/dL (ref 8.9–10.3)
Chloride: 110 mmol/L (ref 98–111)
Creatinine, Ser: 0.74 mg/dL (ref 0.44–1.00)
GFR calc Af Amer: >60 mL/min (ref >60)
GFR calc non Af Amer: >60 mL/min (ref >60)
Glucose, Bld: 70 mg/dL (ref 70–99)
Potassium: 3.7 mmol/L (ref 3.5–5.1)
Sodium: 141 mmol/L (ref 135–145)
Total Bilirubin: 0.4 mg/dL (ref 0.3–1.2)
Total Protein: 7 g/dL (ref 6.5–8.1)

## 2018-10-16 LAB — CBC WITH DIFFERENTIAL/PLATELET
Abs Immature Granulocytes: 0.01 10*3/uL (ref 0.00–0.07)
Basophils Absolute: 0 10*3/uL (ref 0.0–0.1)
Basophils Relative: 0 %
Eosinophils Absolute: 0.1 10*3/uL (ref 0.0–0.5)
Eosinophils Relative: 1 %
HCT: 41.3 % (ref 36.0–46.0)
Hemoglobin: 13.6 g/dL (ref 12.0–15.0)
Immature Granulocytes: 0 %
Lymphocytes Relative: 25 %
Lymphs Abs: 1.9 10*3/uL (ref 0.7–4.0)
MCH: 30.4 pg (ref 26.0–34.0)
MCHC: 32.9 g/dL (ref 30.0–36.0)
MCV: 92.2 fL (ref 80.0–100.0)
Monocytes Absolute: 0.5 10*3/uL (ref 0.1–1.0)
Monocytes Relative: 7 %
Neutro Abs: 5 10*3/uL (ref 1.7–7.7)
Neutrophils Relative %: 67 %
Platelets: 254 10*3/uL (ref 150–400)
RBC: 4.48 MIL/uL (ref 3.87–5.11)
RDW: 11.7 % (ref 11.5–15.5)
WBC: 7.5 10*3/uL (ref 4.0–10.5)
nRBC: 0 % (ref 0.0–0.2)

## 2018-10-16 LAB — I-STAT BETA HCG BLOOD, ED (MC, WL, AP ONLY): I-stat hCG, quantitative: <5 m[IU]/mL (ref <5)

## 2018-10-16 LAB — TROPONIN I: Troponin I: <0.03 ng/mL (ref <0.03)

## 2018-10-16 LAB — D-DIMER, QUANTITATIVE (NOT AT ARMC): D-Dimer, Quant: 0.36 ug/mL-FEU (ref 0.00–0.50)

## 2018-10-16 MED ORDER — ACETAMINOPHEN 325 MG PO TABS
650.0000 mg | ORAL_TABLET | Freq: Once | ORAL | Status: AC
  Administered 2018-10-16: 650 mg via ORAL
  Filled 2018-10-16: qty 2

## 2018-10-16 NOTE — Discharge Instructions (Addendum)
You have been seen today for shortness of breath, cough, and chest pain. Your chest x ray was normal. Your labs were normal. Please read and follow all provided instructions.   1. Medications: continue tylenol/ibuprofen for symptoms, continue antibiotic as previously prescribed, usual home medications 2. Treatment: rest, drink plenty of fluids 3. Follow Up: Please follow up with your primary doctor in 2 days for discussion of your diagnoses and further evaluation after today's visit; if you do not have a primary care doctor use the resource guide provided to find one; Please return to the ER for any new or worsening symptoms. Please obtain all of your results from medical records or have your doctors office obtain the results - share them with your doctor - you should be seen at your doctors office. Call today to arrange your follow up.  4. Please follow instructions for isolation. We did not test you for COVID-19 (coronavirus), but it is still a possibility that you may have been exposed. Please isolate yourself for at least 3 days since recovery without a fever, improvement in respiratory symptoms (cough, shortness of breath), and at least 7 days have passed since symptoms first appeared.   Take medications as prescribed. Please review all of the medicines and only take them if you do not have an allergy to them. Return to the emergency room for worsening condition or new concerning symptoms. Follow up with your regular doctor. If you don't have a regular doctor use one of the numbers below to establish a primary care doctor.  Please be aware that if you are taking birth control pills, taking other prescriptions, ESPECIALLY ANTIBIOTICS may make the birth control ineffective - if this is the case, either do not engage in sexual activity or use alternative methods of birth control such as condoms until you have finished the medicine and your family doctor says it is OK to restart them. If you are on a blood  thinner such as COUMADIN, be aware that any other medicine that you take may cause the coumadin to either work too much, or not enough - you should have your coumadin level rechecked in next 7 days if this is the case.  ?  It is also a possibility that you have an allergic reaction to any of the medicines that you have been prescribed - Everybody reacts differently to medications and while MOST people have no trouble with most medicines, you may have a reaction such as nausea, vomiting, rash, swelling, shortness of breath. If this is the case, please stop taking the medicine immediately and contact your physician.  ?  You should return to the ER if you develop severe or worsening symptoms.   Emergency Department Resource Guide 1) Find a Doctor and Pay Out of Pocket Although you won't have to find out who is covered by your insurance plan, it is a good idea to ask around and get recommendations. You will then need to call the office and see if the doctor you have chosen will accept you as a new patient and what types of options they offer for patients who are self-pay. Some doctors offer discounts or will set up payment plans for their patients who do not have insurance, but you will need to ask so you aren't surprised when you get to your appointment.  2) Contact Your Local Health Department Not all health departments have doctors that can see patients for sick visits, but many do, so it is worth a call  to see if yours does. If you don't know where your local health department is, you can check in your phone book. The CDC also has a tool to help you locate your state's health department, and many state websites also have listings of all of their local health departments.  3) Find a Walk-in Clinic If your illness is not likely to be very severe or complicated, you may want to try a walk in clinic. These are popping up all over the country in pharmacies, drugstores, and shopping centers. They're usually  staffed by nurse practitioners or physician assistants that have been trained to treat common illnesses and complaints. They're usually fairly quick and inexpensive. However, if you have serious medical issues or chronic medical problems, these are probably not your best option.  No Primary Care Doctor: Call Health Connect at  [phone] - they can help you locate a primary care doctor that  accepts your insurance, provides certain services, etc. Physician Referral Service- [phone]  Chronic Pain Problems: Organization         Address  Phone   Notes  [facility]  [phone] Patients need to be referred by their primary care doctor.   Medication Assistance: Organization         Address  Phone   Notes  [facility] [address] [place], [place] [address] [phone] --Must be a resident of [facility] [place] -- Must have NO insurance coverage whatsoever (no Medicaid/ Medicare, etc.) -- The pt. MUST have a primary care doctor that directs their care regularly and follows them in the community   Med[facility]ist  [phone]   [employer]  [phone]    Agencies that provide inexpensive medical care: Organization         Address  Phone   Notes  [facility]  [phone]   [facility]    [phone]   [facility] [address] [place], [place] [address] [phone]   [facility] [address], [place] [phone]   [facility]    [phone]   [facility]    [phone]   [facility]  [address], [place] Phone:  [phone], Fax:  [phone] Hours of Operation:  9 am - 6 pm, M-F.  Also accepts Medicaid/Medicare and self-pay.  [facility]  [address], [place] Phone: [phone], Fax: [phone]. Hours of  Operation:  8:30 am - 5:30 pm, M-F.  Also accepts Medicaid and self-pay.  [facility] [place] [address], [place] Phone: [phone]   [facility] [address], [place], [place] [phone], Ext. [phone] Mondays & Thursdays: 7-9 AM.  First 15 patients are seen on a first come, first serve basis.    Medicaid-accepting [facility] Providers:  Organization         Address  Phone   Notes  [facility] [address], [place] [phone] Also accepts self-pay patients.  [facility] [address], [place]  [phone]) [phone]   [facility] [address], [place] [phone]   [facility] [address] [place] [address], [place] [phone]   Renaye RakersVeita Bland [address], [place]   [phone] Only accepts [place] Access Medicaid patients after they have their name applied to their card.   Self-Pay (no insurance) in [facility]:  Organization         Address  Phone   Notes  Sickle Cell Patients, [facility] [place] [phone]   [facility] [place] [phone]   [facility] [place], [place], [place] [phone]   [facility]/Dr. Osei-Bonsu  [address], [place] [address], [place] [phone] Phone number for both [place] and [place] locations is the same.  [facility] [address] [place] [phone]   [facility] [address] [place] or [address] [phone]   [facility] [address], [place] [phone], phone; [phone], fax Sees patients 1st and 3rd Saturday of every month.  Must not qualify for public or private insurance (i.e. Medicaid, Medicare,  [place] Health Choice, Veterans' Benefits)  Household income should be no more than 200% of the poverty level The clinic cannot treat you if you are pregnant or think you are pregnant  Sexually transmitted diseases are not treated at the clinic.

## 2018-10-16 NOTE — ED Provider Notes (Signed)
[name] Provider Note   CSN: [id number] Arrival date & time: 10/16/18  2009    History   Chief Complaint Chief Complaint  Kelly Clay presents with  . Shortness of Breath  . Fever    HPI Kelly Clay is a 43 y.o. female with a PMH of anxiety, depression, sinus infections and headaches presenting with cough, fever, and shortness of breath onset 1 week ago. Kelly Clay reports fever has been between 63F-101.10F. Kelly Clay reports taking ibuprofen with partial relief. Kelly Clay states last dose of ibuprofen was taken at 6pm.  Kelly Clay states Kelly Clay was evaluated by urgent care yesterday and prescribed Augmentin due to concern for another sinus infection. Kelly Clay reports Kelly Clay has had multiple sinus infections in the past. Kelly Clay reports environmental allergies and states Kelly Clay occasionally takes allergy medicine. Kelly Clay states Kelly Clay is feeling worse today. Kelly Clay reports constant middle non radiating chest pain onset this afternoon at around 6pm. Kelly Clay states nothing makes symptoms better or worse. Kelly Clay describes chest pain as a burning. Kelly Clay reports associated sore throat, body aches, and intermittent frontal mild headaches. Kelly Clay states headache is the same as her typical headaches, but denies a headache currently. 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  Kelly Clay denies neck pain, rash, nausea, vomiting, diarrhea, or abdominal pain. Kelly Clay denies recent travel, sick exposures, or exposure to someone suspected or confirmed COVID-19. Kelly Clay reports using an IUD. Kelly Clay is unsure of LMP and states periods are irregular. Kelly Clay denies recent travel, recent surgery, leg edema/pain, or hx of DVT/PE. Kelly Clay reports occasional alcohol use, but denies tobacco use or drug use.      HPI  Past Medical History:  Diagnosis Date  . Abnormal Pap smear    colpo  . AMA (advanced maternal age) multigravida  35+   . Anxiety   . Complication of anesthesia    epidurals did not take  . Depression   . H/O hiatal hernia   . Headache(784.0)   . Preterm labor    induced at 36wks, PIH  . Urinary tract infection     Kelly Clay Active Problem List   Diagnosis Date Noted  . Anxiety 08/16/2016  . 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 Abnormal auditory perception of both ears 04/01/2016    Past Surgical History:  Procedure Laterality Date  . CESAREAN SECTION  07/26/2012   Procedure: CESAREAN SECTION;  Surgeon: Lavina Hamman, MD;  Location: [facility];  Service: Obstetrics;  Laterality: N/A;  Primary Cesarean Section Delivery Baby Girl @ [phone], Apgars 9/9  . DILATION AND CURETTAGE OF UTERUS    . TONSILLECTOMY       OB History    Gravida  5   Para  3   Term  2   Preterm  1   AB  2   Living  3     SAB  1   TAB  1   Ectopic  0   Multiple  0   Live Births  3            Home Medications    Prior to Admission medications   Medication Sig Start Date End Date Taking? Authorizing Provider  ALPRAZolam Prudy Feeler) 0.5 MG tablet Take 0.5 mg by mouth 2 (two) times daily as needed for anxiety. 09/29/18  Yes [provider]  amoxicillin-clavulanate (AUGMENTIN) 875-125 MG tablet  10/15/18  Yes [provider]  APLENZIN 522  MG TB24 Take 522 mg by mouth daily. 09/29/18  Yes [provider]  diphenhydrAMINE HCl (BENADRYL PO) Take 1 tablet by mouth daily.   Yes [provider]  fexofenadine (ALLEGRA) 180 MG tablet Take 180 mg by mouth daily.   Yes [provider]  gabapentin (NEURONTIN) 100 MG capsule Take 100 mg by mouth 2 (two) times daily as needed for anxiety. 10/09/18  Yes [provider]  ibuprofen (ADVIL,MOTRIN) 200 MG tablet Take 400 mg by mouth daily as needed for mild pain.   Yes [provider]  levonorgestrel (MIRENA) 20 MCG/24HR IUD 1 each by Intrauterine route once.   Yes [provider]  traZODone  (DESYREL) 50 MG tablet Take 50-100 mg by mouth at bedtime as needed for sleep. 07/20/10  Yes [provider]  VYVANSE 30 MG capsule  10/02/18  Yes [provider]  amoxicillin (AMOXIL) 875 MG tablet Take 1 tablet (875 mg total) by mouth 2 (two) times daily. Kelly Clay not taking: Reported on 10/16/2018 08/16/16   Morrell Riddle, PA-C  ondansetron (ZOFRAN ODT) 4 MG disintegrating tablet Take 1 tablet (4 mg total) by mouth every 8 (eight) hours as needed for nausea. Kelly Clay not taking: Reported on 10/16/2018 08/16/16   Morrell Riddle, PA-C    Family History Family History  Problem Relation Age of Onset  . Hypertension Mother   . COPD Mother   . Heart disease Mother   . Thyroid disease Mother   . Hypertension Father   . Diabetes Father   . Birth defects Son        clubbed feet  . Cancer Maternal Grandmother        breast  . Breast cancer Maternal Grandmother   . Other Neg Hx     Social History Social History   Tobacco Use  . Smoking status: Never Smoker  . Smokeless tobacco: Never Used  Substance Use Topics  . Alcohol use: No    Comment: prior to preg  . Drug use: No     Allergies   Imitrex [sumatriptan]; Terbutaline; and Latex   Review of Systems Review of Systems  Constitutional: Negative for activity change, appetite change, chills, diaphoresis, fatigue, fever and unexpected weight change.  HENT: Positive for congestion, rhinorrhea and sore throat.   Eyes: Negative for visual disturbance.  Respiratory: Positive for cough and shortness of breath. Negative for chest tightness and wheezing.   Cardiovascular: Positive for chest pain. Negative for palpitations and leg swelling.  Gastrointestinal: Negative for abdominal pain, nausea and vomiting.  Endocrine: Negative for cold intolerance and heat intolerance.  Genitourinary: Negative for dysuria.  Musculoskeletal: Positive for myalgias. Negative for back pain and neck pain.  Skin: Negative for rash.   Allergic/Immunologic: Positive for environmental allergies. Negative for immunocompromised state.  Neurological: Positive for headaches. Negative for dizziness, syncope, weakness and light-headedness.  Psychiatric/Behavioral: Negative for agitation and behavioral problems. The Kelly Clay is not nervous/anxious.     Physical Exam Updated Vital Signs BP 128/76   Pulse 99   Temp 98.3 F (36.8 C) (Oral)   Resp 18   SpO2 100%   Physical Exam Vitals signs and nursing note reviewed.  Constitutional:      General: Kelly Clay is not in acute distress.    Appearance: Kelly Clay is well-developed. Kelly Clay is not diaphoretic.  HENT:     Head: Normocephalic and atraumatic.     Right Ear: Tympanic membrane, ear canal and external ear normal.     Left Ear:  Tympanic membrane, ear canal and external ear normal.     Nose: Mucosal edema and congestion present. No rhinorrhea.     Right Sinus: Frontal sinus tenderness present. No maxillary sinus tenderness.     Left Sinus: Frontal sinus tenderness present. No maxillary sinus tenderness.     Mouth/Throat:     Mouth: Mucous membranes are moist.     Pharynx: Oropharynx is clear. Uvula midline. No oropharyngeal exudate or posterior oropharyngeal erythema.  Eyes:     Extraocular Movements: Extraocular movements intact.     Pupils: Pupils are equal, round, and reactive to light.  Neck:     Musculoskeletal: Normal range of motion and neck supple.  Cardiovascular:     Rate and Rhythm: Regular rhythm. Tachycardia present.     Heart sounds: Normal heart sounds. No murmur. No friction rub. No gallop.   Pulmonary:     Effort: Pulmonary effort is normal. No accessory muscle usage or respiratory distress.     Breath sounds: Normal breath sounds. No decreased breath sounds, wheezing, rhonchi or rales.     Comments: Kelly Clay is speaking in full sentences in no acute distress on room air. Oxygen saturation was 100% during my exam.  Chest:     Chest wall: No tenderness.  Abdominal:      Palpations: Abdomen is soft.     Tenderness: There is no abdominal tenderness.  Musculoskeletal: Normal range of motion.     Right lower leg: Kelly Clay exhibits no tenderness. No edema.     Left lower leg: Kelly Clay exhibits no tenderness. No edema.  Skin:    General: Skin is warm.     Findings: No erythema or rash.  Neurological:     Mental Status: Kelly Clay is alert.  Psychiatric:        Mood and Affect: Mood is anxious.    ED Treatments / Results  Labs (all labs ordered are listed, but only abnormal results are displayed) Labs Reviewed  CBC WITH DIFFERENTIAL/PLATELET  D-DIMER, QUANTITATIVE (NOT AT [facility])  COMPREHENSIVE METABOLIC PANEL  TROPONIN I  I-STAT BETA HCG BLOOD, ED ([facility], [facility], [facility] ONLY)    EKG EKG Interpretation  Date/Time:  Tuesday October 16 2018 20:55:12 EDT Ventricular Rate:  100 PR Interval:    QRS Duration: 86 QT Interval:  336 QTC Calculation: 434 R Axis:   74 Text Interpretation:  Sinus tachycardia No significant change since last tracing Confirmed by Melene PlanFloyd, Dan [id number]) on 10/16/2018 9:31:20 PM   Radiology Dg Chest Port 1 View  Result Date: 10/16/2018 CLINICAL DATA:  Fever. Shortness of breath. EXAM: PORTABLE CHEST 1 VIEW COMPARISON:  None. FINDINGS: The cardiomediastinal contours are normal. The lungs are clear. Pulmonary vasculature is normal. No consolidation, pleural effusion, or pneumothorax. No acute osseous abnormalities are seen. IMPRESSION: Negative AP view of the chest. Electronically Signed   By: Narda RutherfordMelanie  Sanford M.D.   On: 10/16/2018 20:55   Procedures Procedures (including critical care time)  Medications Ordered in ED Medications  acetaminophen (TYLENOL) tablet 650 mg (650 mg Oral Given 10/16/18 2112)    Initial Impression / Assessment and Plan / ED Course  I have reviewed the triage vital signs and the nursing notes.  Pertinent labs & imaging results that were available during my care of the Kelly Clay were reviewed by me and considered in my medical  decision making (see chart for details).  Clinical Course as of Oct 16 2155  Tue Oct 16, 2018  2059 CXR is negative. No consolidation, pleural effusion, or  pneumothorax. No acute osseous abnormalities are seen. CXR reviewed by me.    DG Chest Port 1 View [AH]  2141 WBCs are within normal limits.  WBC: 7.5 [AH]    Clinical Course User Index [AH] Leretha Dykes, PA-C      Kelly Clay presents with cough, congestion, shortness of breath, fever, and chest pain. Labs, vitals, and imaging reviewed. Chest pain is not likely of cardiac or pulmonary etiology d/t presentation, D dimer is negative, VSS, no tracheal deviation, no JVD or new murmur, RRR, breath sounds equal bilaterally, EKG reveals sinus tachycardia at 100bpm without significant changes since last tracing, negative troponin, and negative CXR. Heart score is zero. Kelly Clay has been advised to return to the ED if CP becomes exertional, associated with diaphoresis or nausea, radiates to left jaw/arm, worsens or becomes concerning in any way.   Patients symptoms are consistent with URI, likely viral etiology. Discussed that antibiotics are not indicated for viral infections. Kelly Clay will be discharged with symptomatic treatment.  Discussed return precautions and discussed quarantine instructions with Kelly Clay. Verbalizes understanding and is agreeable with plan. Kelly Clay is hemodynamically stable & in NAD prior to dc. Kelly Clay is to be discharged with recommendation to follow up with PCP in regards to today's hospital visit. Kelly Clay appears reliable for follow up and is agreeable to discharge.   Kelly Clay was evaluated in Emergency Department on 10/16/2018 for the symptoms described in the history of present illness. Kelly Clay was evaluated in the context of the global COVID-19 pandemic, which necessitated consideration that the Kelly Clay might be at risk for infection with the SARS-CoV-2 virus that causes COVID-19. Institutional protocols and algorithms that pertain to the  evaluation of patients at risk for COVID-19 are in a state of rapid change based on information released by regulatory bodies including the CDC and federal and state organizations. These policies and algorithms were followed during the Kelly Clay's care in the ED.  Case has been discussed with Dr. Adela Lank who agrees with the above plan to discharge.   Final Clinical Impressions(s) / ED Diagnoses   Final diagnoses:  Shortness of breath  Cough  Chest pain in adult    ED Discharge Orders    None       Leretha Dykes, PA-C 10/16/18 2209    Melene Plan, DO 10/17/18 1335

## 2018-10-16 NOTE — ED Triage Notes (Signed)
Pt arrives by POV due to having a fever for the past week and new onset of shortness of breath. Pt went to urgent medical yesterday and was given abx. Pt stated she was not feeling any better, completed the COVID screen and was advised to come here. Pt is not in any distress at this time.

## 2018-10-16 NOTE — Progress Notes (Signed)
  E-Visit for [employer] Virus Screening  Based on what you have shared with me, you need to seek an evaluation for a severe illness that is causing your symptoms which may be coronavirus or some other illness. I recommend that you be seen and evaluated "face to face". Our Emergency Departments are best equipped to handle patients with severe symptoms.  * due to your severe symptoms you should go to the ER as soon as possible   I recommend the following:  . If you are having a true medical emergency please call 911. . If you are considered high risk for Corona virus because of a known exposure, fever, shortness of breath and cough, OR if you have severe symptoms of any kind, seek medical care at an emergency room.  . Please call ahead and tell them that you were seen by telemedicine and they have recommended that you have a face to face evaluation. [facility] Emergency Department [address] [place], [place], [place] [address] [phone]  . [facility] Emergency Department [address] [place], [place] [address] [phone]  . [facility] Emergency Department [address] [place], [place], [place] [address] [phone]  . [facility] Emergency Department [address] [place], [place], [place] [address] [phone]  . [facility] Emergency Department [address] [place], [place], [place] [address] [phone]  NOTE: If you entered your credit card information for this eVisit, you will not be charged. You may see a "hold" on your card for the $35 but that hold will drop off and you will not have a charge processed.   Your e-visit answers were reviewed by a board certified advanced clinical practitioner to complete your personal care plan.  Thank you for using e-Visits.  5 minutes spent reviewing and documenting in chart.

## 2018-10-16 NOTE — ED Notes (Signed)
Pt given and verbalized understanding of d/c instructions and need for follow up with pcp. Told to self quarantine for at least 7 days and return if s/s worsen. No further distress or questions upon ambulation out of department.

## 2018-12-13 ENCOUNTER — Other Ambulatory Visit: Payer: Self-pay | Admitting: Obstetrics and Gynecology

## 2018-12-13 DIAGNOSIS — N644 Mastodynia: Secondary | ICD-10-CM | POA: Diagnosis not present

## 2018-12-13 DIAGNOSIS — Z6832 Body mass index (BMI) 32.0-32.9, adult: Secondary | ICD-10-CM | POA: Diagnosis not present

## 2018-12-13 DIAGNOSIS — Z1389 Encounter for screening for other disorder: Secondary | ICD-10-CM | POA: Diagnosis not present

## 2018-12-13 DIAGNOSIS — Z113 Encounter for screening for infections with a predominantly sexual mode of transmission: Secondary | ICD-10-CM | POA: Diagnosis not present

## 2018-12-13 DIAGNOSIS — Z130 Encounter for screening for diseases of the blood and blood-forming organs and certain disorders involving the immune mechanism: Secondary | ICD-10-CM | POA: Diagnosis not present

## 2018-12-13 DIAGNOSIS — Z01419 Encounter for gynecological examination (general) (routine) without abnormal findings: Secondary | ICD-10-CM | POA: Diagnosis not present

## 2018-12-17 ENCOUNTER — Other Ambulatory Visit: Payer: Self-pay | Admitting: Obstetrics and Gynecology

## 2018-12-17 DIAGNOSIS — F411 Generalized anxiety disorder: Secondary | ICD-10-CM | POA: Diagnosis not present

## 2018-12-17 DIAGNOSIS — F332 Major depressive disorder, recurrent severe without psychotic features: Secondary | ICD-10-CM | POA: Diagnosis not present

## 2018-12-17 DIAGNOSIS — N644 Mastodynia: Secondary | ICD-10-CM

## 2018-12-17 DIAGNOSIS — F4310 Post-traumatic stress disorder, unspecified: Secondary | ICD-10-CM | POA: Diagnosis not present

## 2018-12-27 ENCOUNTER — Other Ambulatory Visit: Payer: BLUE CROSS/BLUE SHIELD

## 2018-12-28 ENCOUNTER — Ambulatory Visit
Admission: RE | Admit: 2018-12-28 | Discharge: 2018-12-28 | Disposition: A | Discharge status: Home / Self Care | Payer: BLUE CROSS/BLUE SHIELD | Source: Ambulatory Visit | Attending: Obstetrics and Gynecology | Admitting: Obstetrics and Gynecology

## 2018-12-28 ENCOUNTER — Other Ambulatory Visit: Payer: Self-pay

## 2018-12-28 ENCOUNTER — Ambulatory Visit
Admission: RE | Admit: 2018-12-28 | Discharge: 2018-12-28 | Disposition: A | Discharge status: Home / Self Care | Payer: BC Managed Care – PPO | Source: Ambulatory Visit | Attending: Obstetrics and Gynecology | Admitting: Obstetrics and Gynecology

## 2018-12-28 DIAGNOSIS — N644 Mastodynia: Secondary | ICD-10-CM

## 2018-12-28 DIAGNOSIS — R922 Inconclusive mammogram: Secondary | ICD-10-CM | POA: Diagnosis not present

## 2019-01-07 DIAGNOSIS — F411 Generalized anxiety disorder: Secondary | ICD-10-CM | POA: Diagnosis not present

## 2019-01-07 DIAGNOSIS — F4323 Adjustment disorder with mixed anxiety and depressed mood: Secondary | ICD-10-CM | POA: Diagnosis not present

## 2019-01-07 DIAGNOSIS — F4310 Post-traumatic stress disorder, unspecified: Secondary | ICD-10-CM | POA: Diagnosis not present

## 2019-01-07 DIAGNOSIS — F332 Major depressive disorder, recurrent severe without psychotic features: Secondary | ICD-10-CM | POA: Diagnosis not present

## 2019-02-06 DIAGNOSIS — F331 Major depressive disorder, recurrent, moderate: Secondary | ICD-10-CM | POA: Diagnosis not present

## 2019-02-06 DIAGNOSIS — F4310 Post-traumatic stress disorder, unspecified: Secondary | ICD-10-CM | POA: Diagnosis not present

## 2019-02-06 DIAGNOSIS — F4323 Adjustment disorder with mixed anxiety and depressed mood: Secondary | ICD-10-CM | POA: Diagnosis not present

## 2019-02-06 DIAGNOSIS — F411 Generalized anxiety disorder: Secondary | ICD-10-CM | POA: Diagnosis not present

## 2019-03-07 DIAGNOSIS — J324 Chronic pansinusitis: Secondary | ICD-10-CM | POA: Diagnosis not present

## 2019-04-08 DIAGNOSIS — D485 Neoplasm of uncertain behavior of skin: Secondary | ICD-10-CM | POA: Diagnosis not present

## 2019-04-08 DIAGNOSIS — D2261 Melanocytic nevi of right upper limb, including shoulder: Secondary | ICD-10-CM | POA: Diagnosis not present

## 2019-04-08 DIAGNOSIS — L570 Actinic keratosis: Secondary | ICD-10-CM | POA: Diagnosis not present

## 2019-04-08 DIAGNOSIS — L820 Inflamed seborrheic keratosis: Secondary | ICD-10-CM | POA: Diagnosis not present

## 2019-04-08 DIAGNOSIS — L821 Other seborrheic keratosis: Secondary | ICD-10-CM | POA: Diagnosis not present

## 2019-04-08 DIAGNOSIS — D225 Melanocytic nevi of trunk: Secondary | ICD-10-CM | POA: Diagnosis not present

## 2019-04-18 DIAGNOSIS — F411 Generalized anxiety disorder: Secondary | ICD-10-CM | POA: Diagnosis not present

## 2019-04-18 DIAGNOSIS — F4310 Post-traumatic stress disorder, unspecified: Secondary | ICD-10-CM | POA: Diagnosis not present

## 2019-04-18 DIAGNOSIS — F331 Major depressive disorder, recurrent, moderate: Secondary | ICD-10-CM | POA: Diagnosis not present

## 2019-04-18 DIAGNOSIS — F4323 Adjustment disorder with mixed anxiety and depressed mood: Secondary | ICD-10-CM | POA: Diagnosis not present

## 2019-05-14 DIAGNOSIS — J0100 Acute maxillary sinusitis, unspecified: Secondary | ICD-10-CM | POA: Diagnosis not present

## 2019-05-14 DIAGNOSIS — J209 Acute bronchitis, unspecified: Secondary | ICD-10-CM | POA: Diagnosis not present

## 2019-05-16 DIAGNOSIS — Z20828 Contact with and (suspected) exposure to other viral communicable diseases: Secondary | ICD-10-CM | POA: Diagnosis not present

## 2019-05-16 DIAGNOSIS — R509 Fever, unspecified: Secondary | ICD-10-CM | POA: Diagnosis not present

## 2019-06-20 DIAGNOSIS — F411 Generalized anxiety disorder: Secondary | ICD-10-CM | POA: Diagnosis not present

## 2019-06-20 DIAGNOSIS — F4310 Post-traumatic stress disorder, unspecified: Secondary | ICD-10-CM | POA: Diagnosis not present

## 2019-06-20 DIAGNOSIS — F4323 Adjustment disorder with mixed anxiety and depressed mood: Secondary | ICD-10-CM | POA: Diagnosis not present

## 2019-06-20 DIAGNOSIS — F331 Major depressive disorder, recurrent, moderate: Secondary | ICD-10-CM | POA: Diagnosis not present

## 2019-10-03 ENCOUNTER — Ambulatory Visit (HOSPITAL_COMMUNITY)
Admission: EM | Admit: 2019-10-03 | Discharge: 2019-10-03 | Disposition: A | Discharge status: Home / Self Care | Payer: BC Managed Care – PPO | Attending: Family Medicine | Admitting: Family Medicine

## 2019-10-03 ENCOUNTER — Encounter (HOSPITAL_COMMUNITY): Payer: Self-pay

## 2019-10-03 ENCOUNTER — Other Ambulatory Visit: Payer: Self-pay

## 2019-10-03 DIAGNOSIS — B029 Zoster without complications: Secondary | ICD-10-CM | POA: Diagnosis not present

## 2019-10-03 MED ORDER — VALACYCLOVIR HCL 1 G PO TABS: 1000.0000 mg | ORAL_TABLET | Freq: Three times a day (TID) | ORAL | 0 refills | Status: DC

## 2019-10-03 MED ORDER — GABAPENTIN 300 MG PO CAPS: ORAL_CAPSULE | ORAL | 0 refills | Status: DC

## 2019-10-03 NOTE — ED Provider Notes (Signed)
[facility]    CSN: [id number] Arrival date & time: 10/03/19  1715      History   Chief Complaint Chief Complaint  Patient presents with  . Rash    HPI Kelly Clay is a 44 y.o. female.   HPI   Patient has multiple complaints about dry skin, itching to skin, or easy bruisability.  She points out multiple areas of her skin that look bruise to her.  There are quite a few very tiny 1 to 2 cm bruises that are not tender on the thigh and arms. She does have some dry skin. She has an area of skin from the right buttock down the lateral right leg to about the knee that is hypersensitive.  The skin feels "numb".  The skin feels "tingling".  She is also noticed some "bug bites". No fever chills.  No body aches.  No recent illness. She had some recent blood work done.  She states that everything was normal.  Her CBC showed a slight increase in her RDW.  She wonders if this is a problem.  I told her it did not indicate a disease process.  Past Medical History:  Diagnosis Date  . Abnormal Pap smear    colpo  . AMA (advanced maternal age) multigravida 35+   . Anxiety   . Complication of anesthesia    epidurals did not take  . Depression   . H/O hiatal hernia   . Headache(784.0)   . Preterm labor    induced at 36wks, PIH  . Urinary tract infection     Patient Active Problem List   Diagnosis Date Noted  . Anxiety 08/16/2016  . 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 Abnormal auditory perception of both ears 04/01/2016    Past Surgical History:  Procedure Laterality Date  . CESAREAN SECTION  07/26/2012   Procedure: CESAREAN SECTION;  Surgeon: Lavina Hamman, MD;  Location: [facility];  Service: Obstetrics;  Laterality: N/A;  Primary Cesarean Section Delivery Baby Girl @ [phone], Apgars 9/9  . DILATION AND CURETTAGE OF UTERUS    . TONSILLECTOMY      OB History    Gravida  5   Para  3   Term  2   Preterm  1   AB  2   Living  3     SAB  1    TAB  1   Ectopic  0   Multiple  0   Live Births  3            Home Medications    Prior to Admission medications   Medication Sig Start Date End Date Taking? Authorizing Provider  ALPRAZolam Prudy Feeler) 0.5 MG tablet Take 0.5 mg by mouth 2 (two) times daily as needed for anxiety. 09/29/18  Yes [provider]  APLENZIN 522 MG TB24 Take 522 mg by mouth daily. 09/29/18  Yes [provider]  gabapentin (NEURONTIN) 100 MG capsule Take 100 mg by mouth 2 (two) times daily as needed for anxiety. 10/09/18  Yes [provider]  traZODone (DESYREL) 50 MG tablet Take 50-100 mg by mouth at bedtime as needed for sleep. 07/20/10  Yes [provider]  VYVANSE 30 MG capsule  10/02/18  Yes [provider]  cloNIDine (CATAPRES) 0.1 MG tablet clonidine HCl 0.1 mg tablet    [provider]  diphenhydrAMINE HCl (BENADRYL PO) Take 1 tablet by mouth daily.    [provider]  fexofenadine (ALLEGRA) 180 MG tablet Take 180 mg by mouth daily.    [provider]  gabapentin (NEURONTIN) 300 MG capsule Take one two to three times a day for nerve pain 10/03/19   Raylene Everts, MD  ibuprofen (ADVIL,MOTRIN) 200 MG tablet Take 400 mg by mouth daily as needed for mild pain.    [provider]  levonorgestrel (MIRENA) 20 MCG/24HR IUD 1 each by Intrauterine route once.    [provider]  valACYclovir (VALTREX) 1000 MG tablet Take 1 tablet (1,000 mg total) by mouth 3 (three) times daily. 10/03/19   Raylene Everts, MD    Family History Family History  Problem Relation Age of Onset  . Hypertension Mother   . COPD Mother   . Heart disease Mother   . Thyroid disease Mother   . Hypertension Father   . Diabetes Father   . Birth defects Son        clubbed feet  . Cancer Maternal Grandmother        breast  . Breast cancer Maternal Grandmother   . Other Neg Hx     Social History Social History   Tobacco Use  . Smoking  status: Never Smoker  . Smokeless tobacco: Never Used  Substance Use Topics  . Alcohol use: No    Comment: prior to preg  . Drug use: No     Allergies   Sumatriptan, Terbutaline, and Latex   Review of Systems Review of Systems  Skin: Positive for color change and rash.     Physical Exam Triage Vital Signs ED Triage Vitals  Enc Vitals Group     BP 10/03/19 1836 128/86     Pulse Rate 10/03/19 1836 89     Resp 10/03/19 1836 18     Temp 10/03/19 1836 98.3 F (36.8 C)     Temp Source 10/03/19 1836 Oral     SpO2 10/03/19 1836 100 %     Weight --      Height --      Head Circumference --      Peak Flow --      Pain Score 10/03/19 1830 7     Pain Loc --      Pain Edu? --      Excl. in [place]? --    No data found.  Updated Vital Signs BP 128/86 (BP Location: Left Arm)   Pulse 89   Temp 98.3 F (36.8 C) (Oral)   Resp 18   SpO2 100%     Physical Exam Constitutional:      General: She is not in acute distress.    Appearance: She is well-developed and normal weight.  HENT:     Head: Normocephalic and atraumatic.  Eyes:     Conjunctiva/sclera: Conjunctivae normal.     Pupils: Pupils are equal, round, and reactive to light.  Cardiovascular:     Rate and Rhythm: Normal rate.  Pulmonary:     Effort: Pulmonary effort is normal. No respiratory distress.  Musculoskeletal:        General: Normal range of motion.     Cervical back: Normal range of motion.  Skin:    General: Skin is warm and dry.     Comments: Skin has tanned and solar damage diffusely.  In the right lateral thigh there are clusters of vesicles on erythematous base in a linear fashion consistent with herpes zoster  Neurological:     Mental Status: She is alert.  Psychiatric:        Mood and Affect: Mood normal.        Behavior: Behavior normal.      UC Treatments / Results  Labs (all labs ordered are listed, but only abnormal results are displayed) Labs Reviewed - No data to  display  EKG   Radiology No results found.  Procedures Procedures (including critical care time)  Medications Ordered in UC Medications - No data to display  Initial Impression / Assessment and Plan / UC Course  I have reviewed the triage vital signs and the nursing notes.  Pertinent labs & imaging results that were available during my care of the patient were reviewed by me and considered in my medical decision making (see chart for details).      Final Clinical Impressions(s) / UC Diagnoses   Final diagnoses:  Herpes zoster without complication     Discharge Instructions     Take the valacyclovir 3 times a day This is an antiviral medication to reduce the shingles Take gabapentin 2-3 times a day This is a medicine for nerve pain.  I gave you an increased dose Use an antiitch lotion on your skin.  Wynelle Fanny is a good choice Follow-up with your primary care doctor   ED Prescriptions    Medication Sig Dispense Auth. Provider   valACYclovir (VALTREX) 1000 MG tablet Take 1 tablet (1,000 mg total) by mouth 3 (three) times daily. 21 tablet Eustace Moore, MD   gabapentin (NEURONTIN) 300 MG capsule Take one two to three times a day for nerve pain 60 capsule Delton See Letta Pate, MD     PDMP not reviewed this encounter.   Eustace Moore, MD 10/03/19 2018

## 2019-10-03 NOTE — ED Triage Notes (Signed)
Pt c/o itching skin for approx 1 week to legs and states that she has "bruises" where she has been scratching. States she went to beach a couple days ago and developed "bumps" to back and back of legs. Is concerned she may have a bruising issue and recent results from lab work as possibly being "anemic". Skin red on bilat arms and chest.  Denies SOB or swelling to lips/face.  Reports taking benadryl last week but "didn't seem to help". Took ibuprofen last night.  Also c/o pain and "bruise" to right hand.

## 2019-10-03 NOTE — Discharge Instructions (Signed)
Take the valacyclovir 3 times a day This is an antiviral medication to reduce the shingles Take gabapentin 2-3 times a day This is a medicine for nerve pain.  I gave you an increased dose Use an antiitch lotion on your skin.  Wynelle Fanny is a good choice Follow-up with your primary care doctor

## 2019-10-23 ENCOUNTER — Ambulatory Visit: Payer: BLUE CROSS/BLUE SHIELD | Admitting: Adult Health Nurse Practitioner

## 2019-10-31 IMAGING — MG DIGITAL DIAGNOSTIC BILATERAL MAMMOGRAM WITH TOMO AND CAD
8 series · 9 of 24 positions shown · non-contrast
Comparison: Baseline screening mammogram dated 09/13/2017.

CLINICAL DATA: Patient was called back from screening mammogram for
possible asymmetries in both breast.

EXAM:
DIGITAL DIAGNOSTIC BILATERAL MAMMOGRAM WITH CAD AND TOMO

[L ML synth-2D]
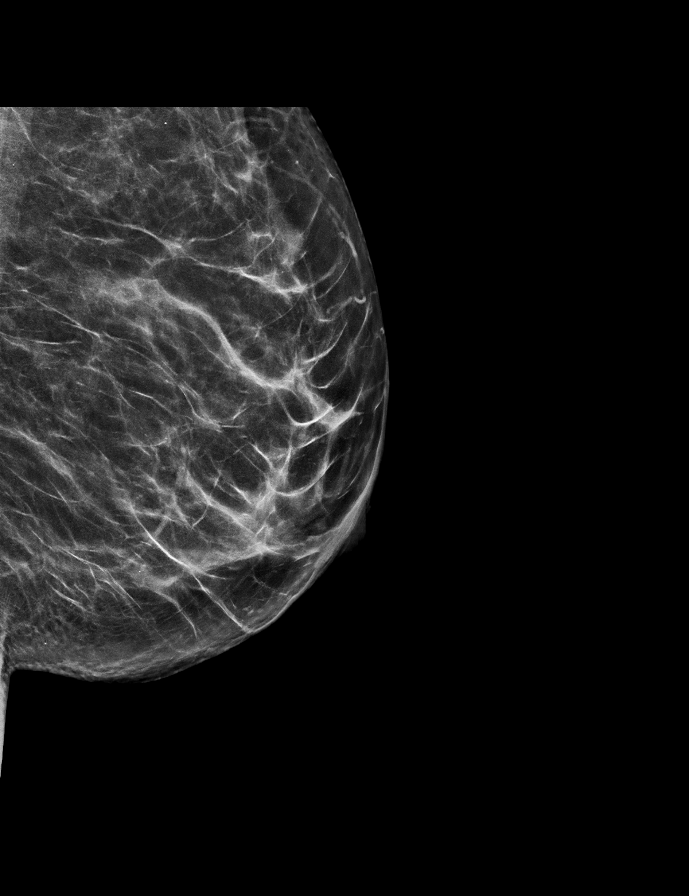

[R MLO synth-2D]
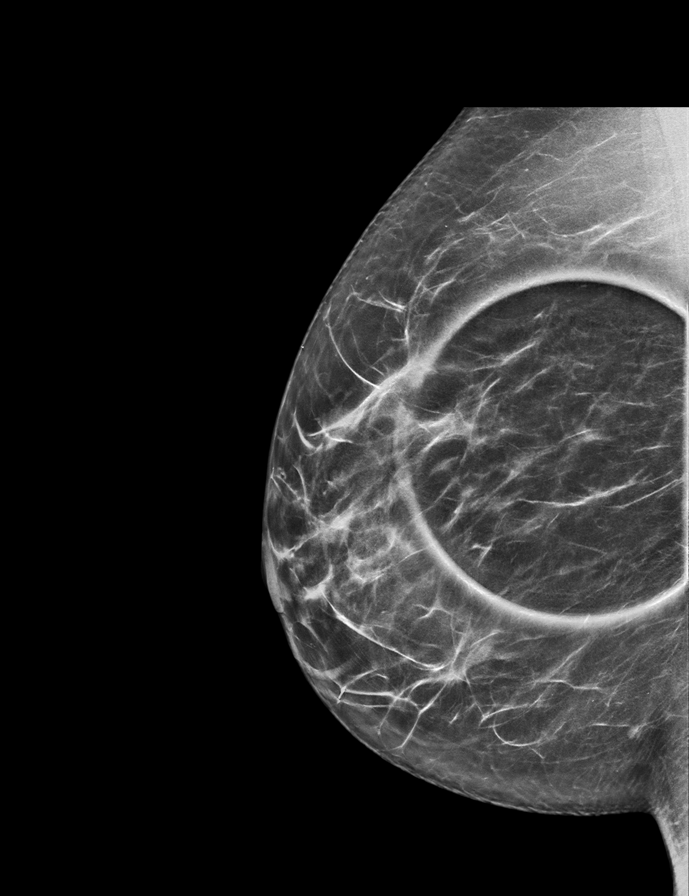

[R ML synth-2D]
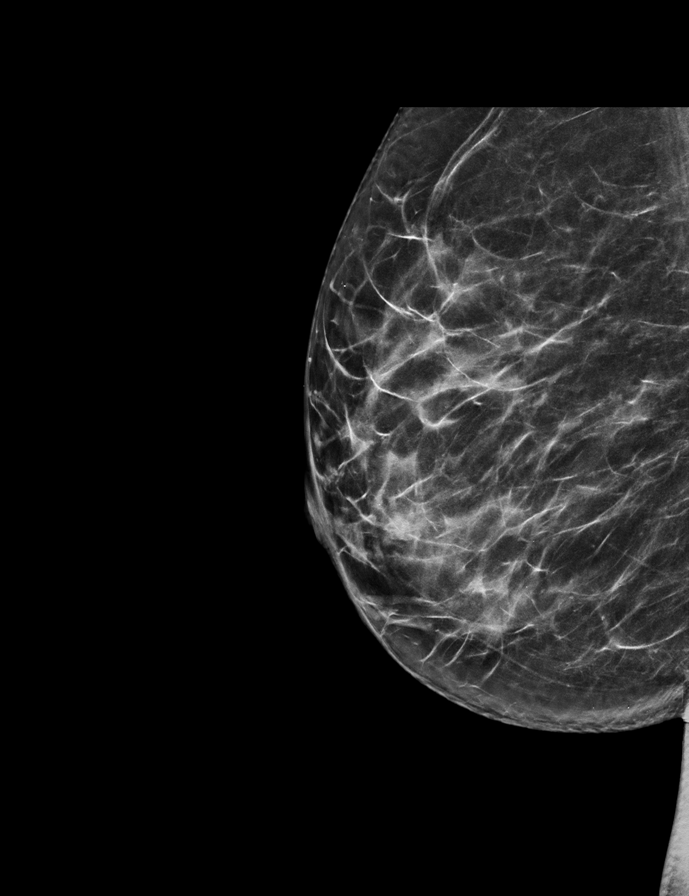

[L MLO synth-2D]
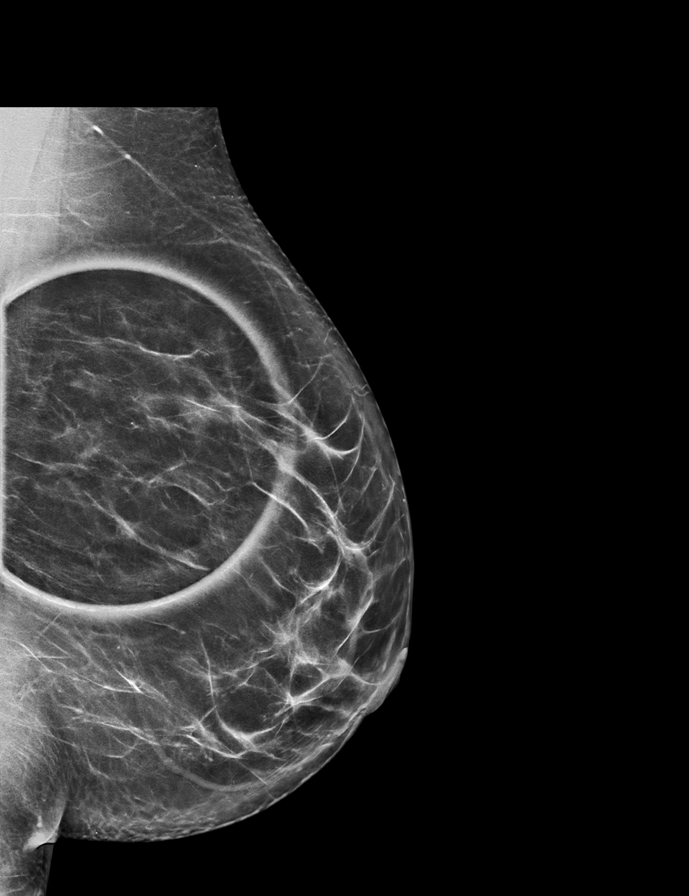

[R ML tomo · 2 of 68 frames shown]
[frame 22/68]
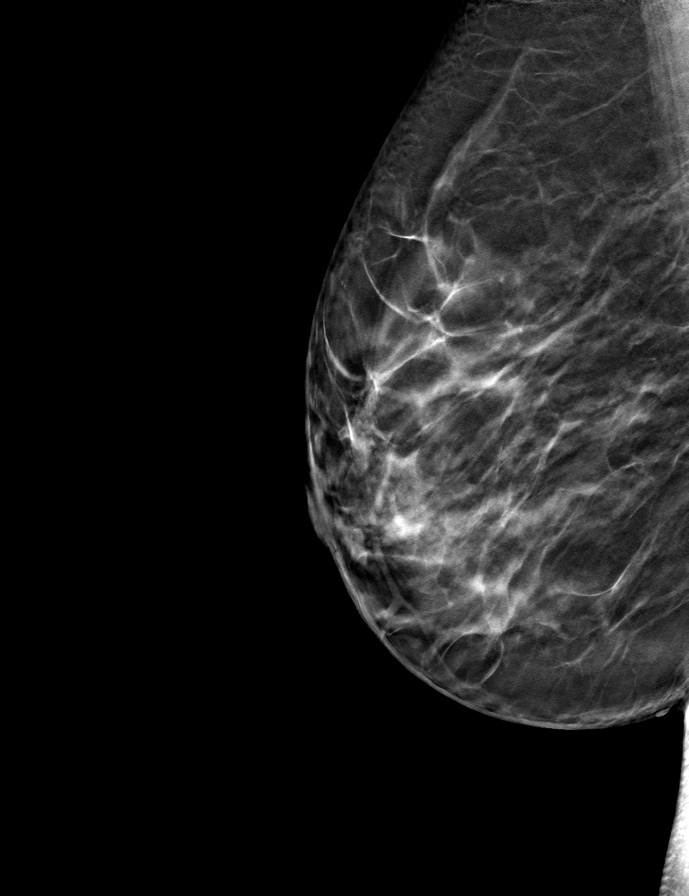
[frame 35/68]
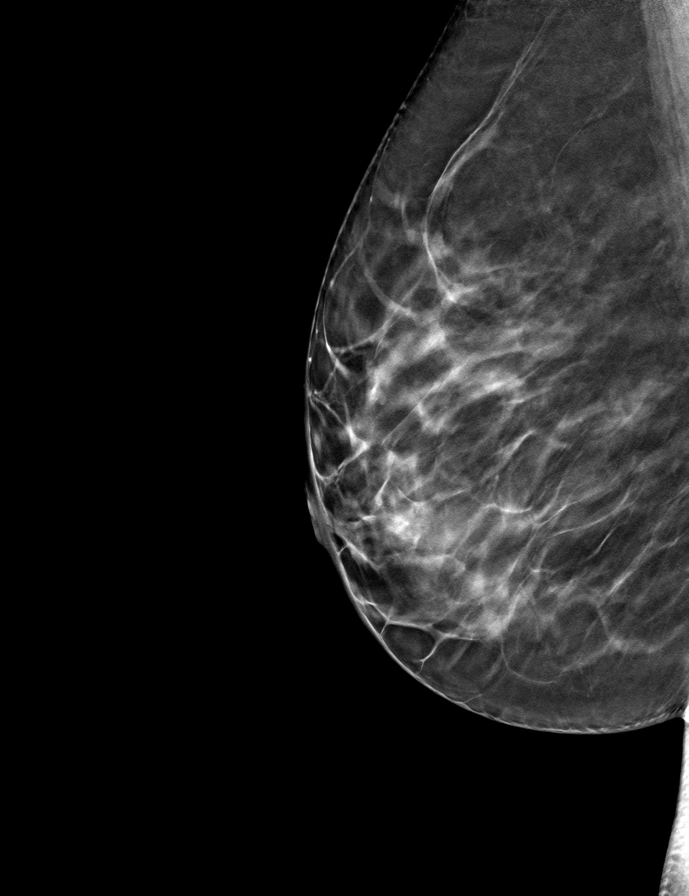

[R MLO tomo · tomo slice 35/68.0]
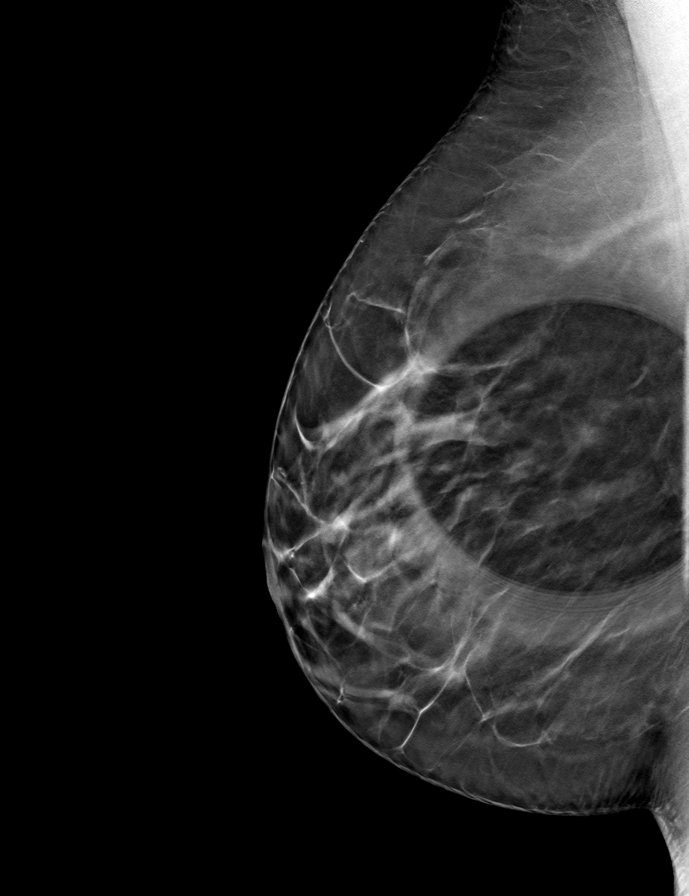

[L ML tomo · tomo slice 34/67.0]
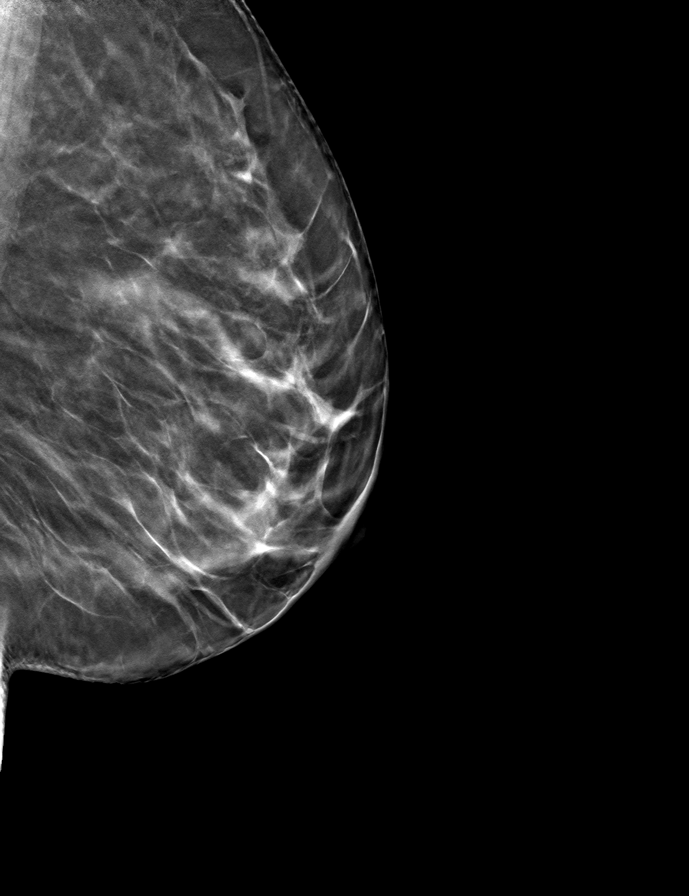

[L MLO tomo · tomo slice 36/71.0]
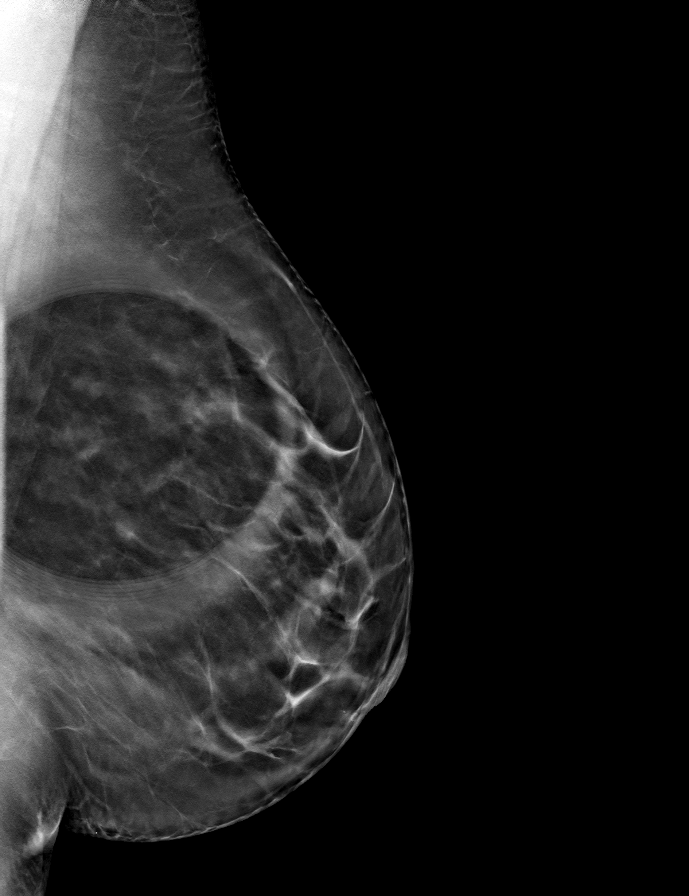

[9 of 24 positions shown; findings below may reference images not displayed]

ACR Breast Density Category b: There are scattered areas of
fibroglandular density.
FINDINGS: Additional imaging of both breast was performed. No persistent mass,
distortion or malignant type microcalcifications identified.

Mammographic images were processed with CAD.
IMPRESSION: No evidence of malignancy in either breast.

RECOMMENDATION:
Bilateral screening mammogram in 1 year is recommended.

I have discussed the findings and recommendations with the patient.
Results were also provided in writing at the conclusion of the
visit. If applicable, a reminder letter will be sent to the patient
regarding the next appointment.

BI-RADS CATEGORY  1: Negative.

## 2019-12-06 ENCOUNTER — Telehealth: Payer: Self-pay | Admitting: General Practice

## 2019-12-06 NOTE — Telephone Encounter (Signed)
Called pt LVMTCB 12/06/19 @ 2;28 PM

## 2019-12-25 ENCOUNTER — Ambulatory Visit: Payer: Self-pay | Admitting: Adult Health Nurse Practitioner

## 2019-12-27 ENCOUNTER — Other Ambulatory Visit: Payer: Self-pay | Admitting: Obstetrics and Gynecology

## 2019-12-27 DIAGNOSIS — R928 Other abnormal and inconclusive findings on diagnostic imaging of breast: Secondary | ICD-10-CM

## 2020-01-08 ENCOUNTER — Ambulatory Visit: Payer: BC Managed Care – PPO

## 2020-01-08 ENCOUNTER — Ambulatory Visit
Admission: RE | Admit: 2020-01-08 | Discharge: 2020-01-08 | Disposition: A | Discharge status: Home / Self Care | Payer: BC Managed Care – PPO | Source: Ambulatory Visit | Attending: Obstetrics and Gynecology | Admitting: Obstetrics and Gynecology

## 2020-01-08 ENCOUNTER — Other Ambulatory Visit: Payer: Self-pay

## 2020-01-08 DIAGNOSIS — R928 Other abnormal and inconclusive findings on diagnostic imaging of breast: Secondary | ICD-10-CM

## 2020-12-14 DIAGNOSIS — F332 Major depressive disorder, recurrent severe without psychotic features: Secondary | ICD-10-CM | POA: Diagnosis not present

## 2020-12-14 DIAGNOSIS — F4310 Post-traumatic stress disorder, unspecified: Secondary | ICD-10-CM | POA: Diagnosis not present

## 2020-12-14 DIAGNOSIS — F4323 Adjustment disorder with mixed anxiety and depressed mood: Secondary | ICD-10-CM | POA: Diagnosis not present

## 2020-12-14 DIAGNOSIS — F411 Generalized anxiety disorder: Secondary | ICD-10-CM | POA: Diagnosis not present

## 2021-01-28 DIAGNOSIS — Z6832 Body mass index (BMI) 32.0-32.9, adult: Secondary | ICD-10-CM | POA: Diagnosis not present

## 2021-01-28 DIAGNOSIS — N644 Mastodynia: Secondary | ICD-10-CM | POA: Diagnosis not present

## 2021-01-28 DIAGNOSIS — Z01419 Encounter for gynecological examination (general) (routine) without abnormal findings: Secondary | ICD-10-CM | POA: Diagnosis not present

## 2021-01-28 DIAGNOSIS — Z130 Encounter for screening for diseases of the blood and blood-forming organs and certain disorders involving the immune mechanism: Secondary | ICD-10-CM | POA: Diagnosis not present

## 2021-01-28 DIAGNOSIS — L03115 Cellulitis of right lower limb: Secondary | ICD-10-CM | POA: Diagnosis not present

## 2021-01-28 DIAGNOSIS — Z1151 Encounter for screening for human papillomavirus (HPV): Secondary | ICD-10-CM | POA: Diagnosis not present

## 2021-01-28 DIAGNOSIS — Z124 Encounter for screening for malignant neoplasm of cervix: Secondary | ICD-10-CM | POA: Diagnosis not present

## 2021-01-28 DIAGNOSIS — Z202 Contact with and (suspected) exposure to infections with a predominantly sexual mode of transmission: Secondary | ICD-10-CM | POA: Diagnosis not present

## 2021-02-02 ENCOUNTER — Other Ambulatory Visit: Payer: Self-pay | Admitting: Obstetrics and Gynecology

## 2021-02-02 DIAGNOSIS — N644 Mastodynia: Secondary | ICD-10-CM

## 2021-02-23 DIAGNOSIS — N870 Mild cervical dysplasia: Secondary | ICD-10-CM | POA: Diagnosis not present

## 2021-02-23 DIAGNOSIS — R87610 Atypical squamous cells of undetermined significance on cytologic smear of cervix (ASC-US): Secondary | ICD-10-CM | POA: Diagnosis not present

## 2021-03-09 ENCOUNTER — Ambulatory Visit
Admission: RE | Admit: 2021-03-09 | Discharge: 2021-03-09 | Disposition: A | Discharge status: Home / Self Care | Payer: BC Managed Care – PPO | Source: Ambulatory Visit | Attending: Obstetrics and Gynecology | Admitting: Obstetrics and Gynecology

## 2021-03-09 ENCOUNTER — Ambulatory Visit: Payer: BC Managed Care – PPO

## 2021-03-09 ENCOUNTER — Other Ambulatory Visit: Payer: Self-pay

## 2021-03-09 DIAGNOSIS — N6452 Nipple discharge: Secondary | ICD-10-CM | POA: Diagnosis not present

## 2021-03-09 DIAGNOSIS — N644 Mastodynia: Secondary | ICD-10-CM

## 2021-03-09 DIAGNOSIS — R922 Inconclusive mammogram: Secondary | ICD-10-CM | POA: Diagnosis not present

## 2021-03-11 ENCOUNTER — Other Ambulatory Visit: Payer: Self-pay | Admitting: Obstetrics and Gynecology

## 2021-03-11 DIAGNOSIS — N6452 Nipple discharge: Secondary | ICD-10-CM

## 2021-03-22 DIAGNOSIS — F332 Major depressive disorder, recurrent severe without psychotic features: Secondary | ICD-10-CM | POA: Diagnosis not present

## 2021-03-22 DIAGNOSIS — F4310 Post-traumatic stress disorder, unspecified: Secondary | ICD-10-CM | POA: Diagnosis not present

## 2021-03-22 DIAGNOSIS — F411 Generalized anxiety disorder: Secondary | ICD-10-CM | POA: Diagnosis not present

## 2021-03-22 DIAGNOSIS — F4323 Adjustment disorder with mixed anxiety and depressed mood: Secondary | ICD-10-CM | POA: Diagnosis not present

## 2021-03-24 ENCOUNTER — Other Ambulatory Visit: Payer: Self-pay

## 2021-03-24 ENCOUNTER — Ambulatory Visit
Admission: RE | Admit: 2021-03-24 | Discharge: 2021-03-24 | Disposition: A | Discharge status: Home / Self Care | Payer: BC Managed Care – PPO | Source: Ambulatory Visit | Attending: Obstetrics and Gynecology | Admitting: Obstetrics and Gynecology

## 2021-03-24 DIAGNOSIS — N6452 Nipple discharge: Secondary | ICD-10-CM

## 2021-03-24 DIAGNOSIS — N644 Mastodynia: Secondary | ICD-10-CM | POA: Diagnosis not present

## 2021-03-24 DIAGNOSIS — N6012 Diffuse cystic mastopathy of left breast: Secondary | ICD-10-CM | POA: Diagnosis not present

## 2021-03-24 DIAGNOSIS — N6011 Diffuse cystic mastopathy of right breast: Secondary | ICD-10-CM | POA: Diagnosis not present

## 2021-03-24 MED ORDER — GADOBUTROL 1 MMOL/ML IV SOLN
8.0000 mL | Freq: Once | INTRAVENOUS | Status: AC | PRN
  Administered 2021-03-24: 8 mL via INTRAVENOUS

## 2021-03-27 ENCOUNTER — Other Ambulatory Visit: Payer: BC Managed Care – PPO

## 2021-06-17 DIAGNOSIS — F332 Major depressive disorder, recurrent severe without psychotic features: Secondary | ICD-10-CM | POA: Diagnosis not present

## 2021-06-17 DIAGNOSIS — F411 Generalized anxiety disorder: Secondary | ICD-10-CM | POA: Diagnosis not present

## 2021-06-17 DIAGNOSIS — F4310 Post-traumatic stress disorder, unspecified: Secondary | ICD-10-CM | POA: Diagnosis not present

## 2021-06-17 DIAGNOSIS — F4323 Adjustment disorder with mixed anxiety and depressed mood: Secondary | ICD-10-CM | POA: Diagnosis not present

## 2021-06-30 DIAGNOSIS — H5213 Myopia, bilateral: Secondary | ICD-10-CM | POA: Diagnosis not present

## 2021-09-16 DIAGNOSIS — F4323 Adjustment disorder with mixed anxiety and depressed mood: Secondary | ICD-10-CM | POA: Diagnosis not present

## 2021-09-16 DIAGNOSIS — F332 Major depressive disorder, recurrent severe without psychotic features: Secondary | ICD-10-CM | POA: Diagnosis not present

## 2021-09-16 DIAGNOSIS — F4310 Post-traumatic stress disorder, unspecified: Secondary | ICD-10-CM | POA: Diagnosis not present

## 2021-09-16 DIAGNOSIS — F411 Generalized anxiety disorder: Secondary | ICD-10-CM | POA: Diagnosis not present

## 2022-03-17 DIAGNOSIS — J019 Acute sinusitis, unspecified: Secondary | ICD-10-CM | POA: Diagnosis not present

## 2022-03-17 DIAGNOSIS — Z20822 Contact with and (suspected) exposure to covid-19: Secondary | ICD-10-CM | POA: Diagnosis not present

## 2022-03-17 DIAGNOSIS — B349 Viral infection, unspecified: Secondary | ICD-10-CM | POA: Diagnosis not present

## 2022-03-20 DIAGNOSIS — N390 Urinary tract infection, site not specified: Secondary | ICD-10-CM | POA: Diagnosis not present

## 2022-03-20 DIAGNOSIS — J019 Acute sinusitis, unspecified: Secondary | ICD-10-CM | POA: Diagnosis not present

## 2022-03-28 DIAGNOSIS — R102 Pelvic and perineal pain: Secondary | ICD-10-CM | POA: Diagnosis not present

## 2022-03-28 DIAGNOSIS — L293 Anogenital pruritus, unspecified: Secondary | ICD-10-CM | POA: Diagnosis not present

## 2022-03-28 DIAGNOSIS — Z202 Contact with and (suspected) exposure to infections with a predominantly sexual mode of transmission: Secondary | ICD-10-CM | POA: Diagnosis not present

## 2022-03-28 DIAGNOSIS — Z113 Encounter for screening for infections with a predominantly sexual mode of transmission: Secondary | ICD-10-CM | POA: Diagnosis not present

## 2022-03-28 DIAGNOSIS — R300 Dysuria: Secondary | ICD-10-CM | POA: Diagnosis not present

## 2022-04-13 DIAGNOSIS — D649 Anemia, unspecified: Secondary | ICD-10-CM | POA: Diagnosis not present

## 2022-04-13 DIAGNOSIS — Z87410 Personal history of cervical dysplasia: Secondary | ICD-10-CM | POA: Diagnosis not present

## 2022-04-13 DIAGNOSIS — Z130 Encounter for screening for diseases of the blood and blood-forming organs and certain disorders involving the immune mechanism: Secondary | ICD-10-CM | POA: Diagnosis not present

## 2022-04-13 DIAGNOSIS — R87611 Atypical squamous cells cannot exclude high grade squamous intraepithelial lesion on cytologic smear of cervix (ASC-H): Secondary | ICD-10-CM | POA: Diagnosis not present

## 2022-04-13 DIAGNOSIS — Z1389 Encounter for screening for other disorder: Secondary | ICD-10-CM | POA: Diagnosis not present

## 2022-04-13 DIAGNOSIS — Z01419 Encounter for gynecological examination (general) (routine) without abnormal findings: Secondary | ICD-10-CM | POA: Diagnosis not present

## 2022-04-18 DIAGNOSIS — Z1231 Encounter for screening mammogram for malignant neoplasm of breast: Secondary | ICD-10-CM | POA: Diagnosis not present

## 2022-05-09 DIAGNOSIS — R87611 Atypical squamous cells cannot exclude high grade squamous intraepithelial lesion on cytologic smear of cervix (ASC-H): Secondary | ICD-10-CM | POA: Diagnosis not present

## 2022-05-09 DIAGNOSIS — N870 Mild cervical dysplasia: Secondary | ICD-10-CM | POA: Diagnosis not present

## 2022-05-09 DIAGNOSIS — N9412 Deep dyspareunia: Secondary | ICD-10-CM | POA: Diagnosis not present

## 2022-06-09 DIAGNOSIS — F411 Generalized anxiety disorder: Secondary | ICD-10-CM | POA: Diagnosis not present

## 2022-06-09 DIAGNOSIS — F4323 Adjustment disorder with mixed anxiety and depressed mood: Secondary | ICD-10-CM | POA: Diagnosis not present

## 2022-06-09 DIAGNOSIS — F4310 Post-traumatic stress disorder, unspecified: Secondary | ICD-10-CM | POA: Diagnosis not present

## 2022-06-09 DIAGNOSIS — F332 Major depressive disorder, recurrent severe without psychotic features: Secondary | ICD-10-CM | POA: Diagnosis not present

## 2022-06-13 DIAGNOSIS — H02413 Mechanical ptosis of bilateral eyelids: Secondary | ICD-10-CM | POA: Diagnosis not present

## 2022-06-13 DIAGNOSIS — H02834 Dermatochalasis of left upper eyelid: Secondary | ICD-10-CM | POA: Diagnosis not present

## 2022-06-13 DIAGNOSIS — H02423 Myogenic ptosis of bilateral eyelids: Secondary | ICD-10-CM | POA: Diagnosis not present

## 2022-06-13 DIAGNOSIS — H02831 Dermatochalasis of right upper eyelid: Secondary | ICD-10-CM | POA: Diagnosis not present

## 2022-06-13 DIAGNOSIS — H0279 Other degenerative disorders of eyelid and periocular area: Secondary | ICD-10-CM | POA: Diagnosis not present

## 2022-07-20 DIAGNOSIS — H53483 Generalized contraction of visual field, bilateral: Secondary | ICD-10-CM | POA: Diagnosis not present

## 2022-08-15 DIAGNOSIS — H02413 Mechanical ptosis of bilateral eyelids: Secondary | ICD-10-CM | POA: Diagnosis not present

## 2022-08-15 DIAGNOSIS — H02423 Myogenic ptosis of bilateral eyelids: Secondary | ICD-10-CM | POA: Diagnosis not present

## 2022-09-05 DIAGNOSIS — F332 Major depressive disorder, recurrent severe without psychotic features: Secondary | ICD-10-CM | POA: Diagnosis not present

## 2022-09-05 DIAGNOSIS — F4323 Adjustment disorder with mixed anxiety and depressed mood: Secondary | ICD-10-CM | POA: Diagnosis not present

## 2022-09-05 DIAGNOSIS — F411 Generalized anxiety disorder: Secondary | ICD-10-CM | POA: Diagnosis not present

## 2022-09-05 DIAGNOSIS — F4310 Post-traumatic stress disorder, unspecified: Secondary | ICD-10-CM | POA: Diagnosis not present

## 2022-09-29 DIAGNOSIS — H52223 Regular astigmatism, bilateral: Secondary | ICD-10-CM | POA: Diagnosis not present

## 2022-10-03 ENCOUNTER — Encounter: Payer: Self-pay | Admitting: Internal Medicine

## 2022-10-03 ENCOUNTER — Ambulatory Visit (INDEPENDENT_AMBULATORY_CARE_PROVIDER_SITE_OTHER): Payer: BC Managed Care – PPO | Admitting: Internal Medicine

## 2022-10-03 VITALS — BP 100/68 | HR 62 | Temp 98.7°F | Ht 63.0 in | Wt 190.0 lb

## 2022-10-03 DIAGNOSIS — Z8349 Family history of other endocrine, nutritional and metabolic diseases: Secondary | ICD-10-CM | POA: Diagnosis not present

## 2022-10-03 DIAGNOSIS — Z975 Presence of (intrauterine) contraceptive device: Secondary | ICD-10-CM | POA: Insufficient documentation

## 2022-10-03 DIAGNOSIS — F419 Anxiety disorder, unspecified: Secondary | ICD-10-CM

## 2022-10-03 DIAGNOSIS — Z0000 Encounter for general adult medical examination without abnormal findings: Secondary | ICD-10-CM

## 2022-10-03 LAB — COMPREHENSIVE METABOLIC PANEL
ALT: 8 U/L (ref 0–35)
AST: 18 U/L (ref 0–37)
Albumin: 4.1 g/dL (ref 3.5–5.2)
Alkaline Phosphatase: 36 U/L — ABNORMAL LOW (ref 39–117)
BUN: 12 mg/dL (ref 6–23)
CO2: 27 mEq/L (ref 19–32)
Calcium: 9 mg/dL (ref 8.4–10.5)
Chloride: 107 mEq/L (ref 96–112)
Creatinine, Ser: 0.84 mg/dL (ref 0.40–1.20)
GFR: 83.83 mL/min (ref >60.00)
Glucose, Bld: 84 mg/dL (ref 70–99)
Potassium: 4.2 mEq/L (ref 3.5–5.1)
Sodium: 138 mEq/L (ref 135–145)
Total Bilirubin: 0.4 mg/dL (ref 0.2–1.2)
Total Protein: 6.6 g/dL (ref 6.0–8.3)

## 2022-10-03 LAB — CBC
HCT: 41.6 % (ref 36.0–46.0)
Hemoglobin: 14.4 g/dL (ref 12.0–15.0)
MCHC: 34.8 g/dL (ref 30.0–36.0)
MCV: 88.7 fl (ref 78.0–100.0)
Platelets: 320 10*3/uL (ref 150.0–400.0)
RBC: 4.69 Mil/uL (ref 3.87–5.11)
RDW: 12.4 % (ref 11.5–15.5)
WBC: 6.3 10*3/uL (ref 4.0–10.5)

## 2022-10-03 LAB — LIPID PANEL
Cholesterol: 168 mg/dL (ref 0–200)
HDL: 38.2 mg/dL — ABNORMAL LOW (ref >39.00)
LDL Cholesterol: 114 mg/dL — ABNORMAL HIGH (ref 0–99)
NonHDL: 130.01
Total CHOL/HDL Ratio: 4
Triglycerides: 81 mg/dL (ref 0.0–149.0)
VLDL: 16.2 mg/dL (ref 0.0–40.0)

## 2022-10-03 NOTE — Assessment & Plan Note (Signed)
Checking TSH for screening.

## 2022-10-03 NOTE — Assessment & Plan Note (Signed)
Flu shot declines. Tetanus declines. Colonoscopy declines. Mammogram up to date getting records, pap smear up to date getting records. Counseled about sun safety and mole surveillance. Counseled about the dangers of distracted driving. Given 10 year screening recommendations.

## 2022-10-03 NOTE — Progress Notes (Signed)
   Subjective:   Patient ID: Kelly Clay, female    DOB: 1976-07-08, 47 y.o.   MRN: [id number]  HPI The patient is a new 47 YO female coming in for physical,  PMH, [facility], social history reviewed and updated  Review of Systems  Constitutional: Negative.   HENT: Negative.    Eyes: Negative.   Respiratory:  Negative for cough, chest tightness and shortness of breath.   Cardiovascular:  Negative for chest pain, palpitations and leg swelling.  Gastrointestinal:  Negative for abdominal distention, abdominal pain, constipation, diarrhea, nausea and vomiting.  Musculoskeletal: Negative.   Skin: Negative.   Neurological: Negative.   Psychiatric/Behavioral: Negative.      Objective:  Physical Exam Constitutional:      Appearance: She is well-developed.  HENT:     Head: Normocephalic and atraumatic.  Cardiovascular:     Rate and Rhythm: Normal rate and regular rhythm.  Pulmonary:     Effort: Pulmonary effort is normal. No respiratory distress.     Breath sounds: Normal breath sounds. No wheezing or rales.  Abdominal:     General: Bowel sounds are normal. There is no distension.     Palpations: Abdomen is soft.     Tenderness: There is no abdominal tenderness. There is no rebound.  Musculoskeletal:     Cervical back: Normal range of motion.  Skin:    General: Skin is warm and dry.  Neurological:     Mental Status: She is alert and oriented to person, place, and time.     Coordination: Coordination normal.     Vitals:   10/03/22 1032  BP: 100/68  Pulse: 62  Temp: 98.7 F (37.1 C)  TempSrc: Oral  SpO2: 97%  Weight: 190 lb (86.2 kg)  Height: 5\' 3"  (1.6 m)    Assessment & Plan:

## 2022-10-03 NOTE — Assessment & Plan Note (Signed)
With mirena and no issues. Sees ob/gyn regularly.

## 2022-10-03 NOTE — Assessment & Plan Note (Signed)
Sees mental health and overall doing well. Does not request medication changes so none are made today.

## 2022-10-04 LAB — TSH: TSH: 2.93 u[IU]/mL (ref 0.35–5.50)

## 2022-11-16 DIAGNOSIS — F332 Major depressive disorder, recurrent severe without psychotic features: Secondary | ICD-10-CM | POA: Diagnosis not present

## 2022-11-16 DIAGNOSIS — F4323 Adjustment disorder with mixed anxiety and depressed mood: Secondary | ICD-10-CM | POA: Diagnosis not present

## 2022-11-16 DIAGNOSIS — F411 Generalized anxiety disorder: Secondary | ICD-10-CM | POA: Diagnosis not present

## 2022-11-16 DIAGNOSIS — F4310 Post-traumatic stress disorder, unspecified: Secondary | ICD-10-CM | POA: Diagnosis not present

## 2023-04-18 ENCOUNTER — Ambulatory Visit (INDEPENDENT_AMBULATORY_CARE_PROVIDER_SITE_OTHER): Payer: 59 | Admitting: Internal Medicine

## 2023-04-18 ENCOUNTER — Encounter: Payer: Self-pay | Admitting: Internal Medicine

## 2023-04-18 VITALS — BP 112/60 | HR 79 | Temp 98.0°F | Ht 63.0 in | Wt 186.0 lb

## 2023-04-18 DIAGNOSIS — R5383 Other fatigue: Secondary | ICD-10-CM | POA: Insufficient documentation

## 2023-04-18 DIAGNOSIS — R0789 Other chest pain: Secondary | ICD-10-CM | POA: Insufficient documentation

## 2023-04-18 LAB — COMPREHENSIVE METABOLIC PANEL
ALT: 5 U/L (ref 0–35)
AST: 12 U/L (ref 0–37)
Albumin: 4.1 g/dL (ref 3.5–5.2)
Alkaline Phosphatase: 38 U/L — ABNORMAL LOW (ref 39–117)
BUN: 9 mg/dL (ref 6–23)
CO2: 24 meq/L (ref 19–32)
Calcium: 9 mg/dL (ref 8.4–10.5)
Chloride: 109 meq/L (ref 96–112)
Creatinine, Ser: 0.76 mg/dL (ref 0.40–1.20)
GFR: 94.17 mL/min (ref >60.00)
Glucose, Bld: 80 mg/dL (ref 70–99)
Potassium: 4.5 meq/L (ref 3.5–5.1)
Sodium: 139 meq/L (ref 135–145)
Total Bilirubin: 0.4 mg/dL (ref 0.2–1.2)
Total Protein: 6.6 g/dL (ref 6.0–8.3)

## 2023-04-18 LAB — CBC
HCT: 37.8 % (ref 36.0–46.0)
Hemoglobin: 12.8 g/dL (ref 12.0–15.0)
MCHC: 33.9 g/dL (ref 30.0–36.0)
MCV: 90.1 fL (ref 78.0–100.0)
Platelets: 317 10*3/uL (ref 150.0–400.0)
RBC: 4.19 Mil/uL (ref 3.87–5.11)
RDW: 12.7 % (ref 11.5–15.5)
WBC: 5.1 10*3/uL (ref 4.0–10.5)

## 2023-04-18 LAB — FERRITIN: Ferritin: 59.1 ng/mL (ref 10.0–291.0)

## 2023-04-18 LAB — VITAMIN D 25 HYDROXY (VIT D DEFICIENCY, FRACTURES): VITD: 34.39 ng/mL (ref 30.00–100.00)

## 2023-04-18 LAB — BRAIN NATRIURETIC PEPTIDE: Pro B Natriuretic peptide (BNP): 45 pg/mL (ref 0.0–100.0)

## 2023-04-18 LAB — VITAMIN B12: Vitamin B-12: 299 pg/mL (ref 211–911)

## 2023-04-18 LAB — TSH: TSH: 2.61 u[IU]/mL (ref 0.35–5.50)

## 2023-04-18 LAB — TROPONIN I (HIGH SENSITIVITY): High Sens Troponin I: 2 ng/L (ref 2–17)

## 2023-04-18 NOTE — Addendum Note (Signed)
Addended by: Hillard Danker A on: 04/18/2023 10:52 AM   Modules accepted: Orders

## 2023-04-18 NOTE — Progress Notes (Signed)
   Subjective:   Patient ID: Kelly Clay, female    DOB: 1976/03/15, 47 y.o.   MRN: [id number]  HPI The patient is a 47 YO female coming in for chest pain. Going on Sunday and Monday. Was an episode with severe viselike pain in the chest and pain down left arm with numbness and pain going into left jaw. Strong family history of early heart disease and father massive heart attack early 39s with death. Another cousin recently heart attack died at 63. Mom with heart attacks starting in 50s. Also has had new extreme fatigue in the past few months which has caused activity to change including sleeping for whole weekends (waking to eat) without improvement in fatigue symptoms.   Review of Systems  Constitutional:  Positive for activity change and fatigue.  HENT: Negative.    Eyes: Negative.   Respiratory:  Positive for chest tightness. Negative for cough and shortness of breath.   Cardiovascular:  Positive for chest pain. Negative for palpitations and leg swelling.  Gastrointestinal:  Negative for abdominal distention, abdominal pain, constipation, diarrhea, nausea and vomiting.  Musculoskeletal: Negative.   Skin: Negative.   Neurological: Negative.   Psychiatric/Behavioral: Negative.      Objective:  Physical Exam Constitutional:      Appearance: She is well-developed.  HENT:     Head: Normocephalic and atraumatic.  Cardiovascular:     Rate and Rhythm: Normal rate and regular rhythm.  Pulmonary:     Effort: Pulmonary effort is normal. No respiratory distress.     Breath sounds: Normal breath sounds. No wheezing or rales.  Abdominal:     General: Bowel sounds are normal. There is no distension.     Palpations: Abdomen is soft.     Tenderness: There is no abdominal tenderness. There is no rebound.  Musculoskeletal:     Cervical back: Normal range of motion.  Skin:    General: Skin is warm and dry.  Neurological:     Mental Status: She is alert and oriented to person, place,  and time.     Coordination: Coordination normal.     Vitals:   04/18/23 1018  BP: 112/60  Pulse: 79  Temp: 98 F (36.7 C)  TempSrc: Oral  SpO2: 95%  Weight: 186 lb (84.4 kg)  Height: 5\' 3"  (1.6 m)   EKG: Rate 74, axis normal, interval normal, sinus, no st or t wave changes, no significant change compared to prior (prior tachycardia is resolved on today's EKG).    Assessment & Plan:  Visit time 25 minutes in face to face communication with patient and coordination of care, additional 10 minutes spent in record review, coordination or care, ordering tests, communicating/referring to other healthcare professionals, documenting in medical records all on the same day of the visit for total time 35 minutes spent on the visit.

## 2023-04-18 NOTE — Assessment & Plan Note (Addendum)
EKG done today without changes. New chest pain worrisome for cardiac related. Symptoms present for <5 minutes, radiated to left arm and left jaw. She did not seek care during episode which happened about 2 days ago. Checking CBC, CMP, troponin, BNP today. I recommend referral to cardiology for workup for angina given strong family history of severe CAD (2+ family members with death from cardiac cause at or around 89, mom with heart attacks starting in 6s). BP is controlled today.

## 2023-04-18 NOTE — Patient Instructions (Signed)
We will check the labs today and get you in with a cardiologist.

## 2023-04-18 NOTE — Assessment & Plan Note (Signed)
Unclear etiology and going on several months without relief with sleeping extra. Strong family history of thyroid disease. Checking B12, vitamin D, TSH CBC, and CMP and adjust as needed.

## 2023-04-19 ENCOUNTER — Ambulatory Visit: Payer: 59 | Attending: Cardiology | Admitting: Cardiology

## 2023-04-19 ENCOUNTER — Encounter: Payer: Self-pay | Admitting: Cardiology

## 2023-04-19 VITALS — BP 110/70 | HR 86 | Ht 62.0 in | Wt 186.0 lb

## 2023-04-19 DIAGNOSIS — R072 Precordial pain: Secondary | ICD-10-CM | POA: Insufficient documentation

## 2023-04-19 DIAGNOSIS — Z8249 Family history of ischemic heart disease and other diseases of the circulatory system: Secondary | ICD-10-CM | POA: Insufficient documentation

## 2023-04-19 MED ORDER — ASPIRIN 81 MG PO TBEC: 81.0000 mg | DELAYED_RELEASE_TABLET | Freq: Every day | ORAL | 3 refills | Status: DC

## 2023-04-19 MED ORDER — IVABRADINE HCL 7.5 MG PO TABS: ORAL_TABLET | ORAL | 0 refills | Status: DC

## 2023-04-19 MED ORDER — ROSUVASTATIN CALCIUM 10 MG PO TABS: 10.0000 mg | ORAL_TABLET | Freq: Every day | ORAL | 0 refills | Status: DC

## 2023-04-19 MED ORDER — NITROGLYCERIN 0.4 MG SL SUBL: 0.4000 mg | SUBLINGUAL_TABLET | SUBLINGUAL | 3 refills | Status: AC | PRN

## 2023-04-19 NOTE — Patient Instructions (Addendum)
Medication Instructions:  Your physician has recommended you make the following change in your medication:  START: Aspirin 81 mg by mouth once daily  START: Nitroglycerin 0.4 mg under your tongue every 5 minutes for 15 minutes as needed for Chest Pain.    START: rosuvastatin (Crestor) 10 mg by mouth once daily  *If you need a refill on your cardiac medications before your next appointment, please call your pharmacy*   Lab Work: NONE If you have labs (blood work) drawn today and your tests are completely normal, you will receive your results only by: MyChart Message (if you have MyChart) OR A paper copy in the mail If you have any lab test that is abnormal or we need to change your treatment, we will call you to review the results.   Testing/Procedures: Your physician has requested that you have cardiac CT. Cardiac computed tomography (CT) is a painless test that uses an x-ray machine to take clear, detailed pictures of your heart. For further information please visit [email or web address] Please follow instruction sheet as given.     Follow-Up: At [facility], you and your health needs are our priority.  As part of our continuing mission to provide you with exceptional heart care, we have created designated Provider Care Teams.  These Care Teams include your primary Cardiologist (physician) and Advanced Practice Providers (APPs -  Physician Assistants and Nurse Practitioners) who all work together to provide you with the care you need, when you need it.   Your next appointment:   4-6 week(s)  Provider:   Jari Favre, PA-C, Ronie Spies, PA-C, Robin Searing, NP, Jacolyn Reedy, PA-C, Eligha Bridegroom, NP, Tereso Newcomer, PA-C, or Perlie Gold, PA-C        OTHER INSTRUCTIONS  Your cardiac CT will be scheduled at one of the below locations:   [facility] [address] [place], [place] [address] [phone]  OR  [facility] [address] [place], [place] [address] [phone]  OR   [facility] [address] [place], [place] [address] [phone]  If scheduled at [facility], please arrive at the [facility] and Children's Entrance (Entrance C2) of [facility] 30 minutes prior to test start time. You can use the FREE valet parking offered at entrance C (encouraged to control the heart rate for the test)  Proceed to the [facility] Radiology Department (first floor) to check-in and test prep.  All radiology patients and guests should use entrance C2 at [facility], accessed from [facility], even though the hospital's physical address listed is [address]    If scheduled at [facility] or [facility], please arrive 15 mins early for check-in and test prep.  There is spacious parking and easy access to the radiology department from the [facility] entrance. Please enter here and check-in with the desk attendant.   Please follow these instructions carefully (unless otherwise directed):  An IV will be required for this test and Nitroglycerin will be given.  Hold all erectile dysfunction medications at least 3 days (72 hrs) prior to test. (Ie viagra, cialis, sildenafil, tadalafil, etc)   On the Night Before the Test: Be sure to Drink plenty of water. Do not consume any caffeinated/decaffeinated beverages or chocolate 12 hours prior to your test. Do not take any antihistamines 12 hours prior to your test. If the patient has contrast  allergy: Patient will need a prescription for Prednisone and very clear instructions (as follows): Prednisone 50 mg - take 13 hours prior to test Take another Prednisone 50 mg 7 hours prior to test Take another Prednisone 50 mg 1 hour prior to test Take Benadryl 50 mg 1 hour prior to test Patient must complete all four doses of above  prophylactic medications. Patient will need a ride after test due to Benadryl.  On the Day of the Test: Drink plenty of water until 1 hour prior to the test. Do not eat any food 1 hour prior to test. You may take your regular medications prior to the test.  Take ivabradine (Corlanor) 15 mg by mouth two hours prior to test. If you take Furosemide/Hydrochlorothiazide/Spironolactone, please HOLD on the morning of the test. FEMALES- please wear underwire-free bra if available, avoid dresses & tight clothing       After the Test: Drink plenty of water. After receiving IV contrast, you may experience a mild flushed feeling. This is normal. On occasion, you may experience a mild rash up to 24 hours after the test. This is not dangerous. If this occurs, you can take Benadryl 25 mg and increase your fluid intake. If you experience trouble breathing, this can be serious. If it is severe call 911 IMMEDIATELY. If it is mild, please call our office. If you take any of these medications: Glipizide/Metformin, Avandament, Glucavance, please do not take 48 hours after completing test unless otherwise instructed.  We will call to schedule your test 2-4 weeks out understanding that some insurance companies will need an authorization prior to the service being performed.   For more information and frequently asked questions, please visit our website : [email or web address]  For non-scheduling related questions, please contact the cardiac imaging nurse navigator should you have any questions/concerns: Cardiac Imaging Nurse Navigators Direct Office Dial: [phone]   For scheduling needs, including cancellations and rescheduling, please call [place], [phone].

## 2023-04-19 NOTE — Progress Notes (Signed)
Cardiology Office Note:  .   Date:  04/19/2023  ID:  Jocilynn Grade, DOB 1976/05/15, MRN [id number] PCP: Myrlene Broker, MD  [facility] Providers Cardiologist:  Truett Mainland, MD PCP: Myrlene Broker, MD  Chief Complaint  Patient presents with   Chest Pain      History of Present Illness: .    Kelly Clay is a 47 y.o. female with family history of early CAD, presenting with chest pain  Four days ago, patient had an episode of chest pain with radiation to her left arm.  Patient was driving, did not have any particular mental stress factors.  Pain was off and on, squeezing-like, radiating to left arm with: Numbness in her left arm, as well as radiating to her jaw.  She feels very tired and fatigued.  Entire episode lasted for less than 30 minutes.  Patient did not seek any emergency medical care at that time.  Another such episode occurred this morning after a stressful moment at work, lasted for less than 5 minutes.  Was no associated shortness of breath, presyncope or syncope.  Patient is non-smoker, has extensive family Stryffeler CAD with multiple family members with MI in their 15s and 68s, including her mother.  Patient works at [employer] ([employer]), work is mostly sedentary.  She does not get much time to exercise outside of work, has been exhausted lately with day-to-day activities.  She eats steak roughly twice a week.  Vitals:   04/19/23 1545  BP: 110/70  Pulse: 86  SpO2: 96%     ROS:  Review of Systems  Cardiovascular:  Positive for chest pain. Negative for dyspnea on exertion, leg swelling, palpitations and syncope.     Studies Reviewed: Marland Kitchen       Labs 04/18/2023: AlKP 38, otherwise normal ProBNP 45 normal  10/03/2022: Chol 168, TG 81, HDL 38, LDL 114   Physical Exam:   Physical Exam Vitals and nursing note reviewed.  Constitutional:      General: She is not in acute distress. Neck:     Vascular: No JVD.   Cardiovascular:     Rate and Rhythm: Normal rate and regular rhythm.     Heart sounds: Normal heart sounds. No murmur heard. Pulmonary:     Effort: Pulmonary effort is normal.     Breath sounds: Normal breath sounds. No wheezing or rales.  Musculoskeletal:     Right lower leg: No edema.     Left lower leg: No edema.      VISIT DIAGNOSES:   ICD-10-CM   1. Precordial pain  R07.2 EKG 12-Lead    CT CORONARY MORPH W/CTA COR W/SCORE W/CA W/CM &/OR WO/CM    2. Family history of early CAD  Z82.49        ASSESSMENT AND PLAN: .    Sadiya Durand is a 47 y.o. female with  family history of early CAD, presenting with chest pain  Chest pain: Symptoms concerning for unstable angina.  Today, patient has no chest pain.  EKG is completely normal.  With her strong family history of early CAD, I am concerned about possibility of obstructive CAD.  Recommend aspirin 81 mg daily, Crestor 10 mg daily, sublingual nitroglycerin as needed.  We will proceed with coronary CT angiogram.  Should CTA show any obstructive CAD, she will need medical and/or invasive management for the same.  If calcium score 0, it may be reasonable to then stop Crestor and give a chance  to diet and lifestyle modifications.    I will arrange follow-up with an APP in 4 to 6 weeks to follow-up on her chest pain.  Depending on results of above testing, we will discuss further.  Meds ordered this encounter  Medications   nitroGLYCERIN (NITROSTAT) 0.4 MG SL tablet    Sig: Place 1 tablet (0.4 mg total) under the tongue every 5 (five) minutes as needed.    Dispense:  25 tablet    Refill:  3   ivabradine (CORLANOR) 7.5 MG TABS tablet    Sig: Take 2 hours prior to Cardiac CT    Dispense:  2 tablet    Refill:  0   aspirin EC 81 MG tablet    Sig: Take 1 tablet (81 mg total) by mouth daily. Swallow whole.    Dispense:  90 tablet    Refill:  3   rosuvastatin (CRESTOR) 10 MG tablet    Sig: Take 1 tablet (10 mg total) by mouth  daily.    Dispense:  30 tablet    Refill:  0     F/u in 4-6 weeks  Signed, Elder Negus, MD

## 2023-04-24 ENCOUNTER — Other Ambulatory Visit: Payer: Self-pay

## 2023-04-24 ENCOUNTER — Encounter (HOSPITAL_COMMUNITY): Payer: Self-pay | Admitting: Emergency Medicine

## 2023-04-24 ENCOUNTER — Observation Stay (HOSPITAL_COMMUNITY)
Admission: EM | Admit: 2023-04-24 | Discharge: 2023-04-26 | Disposition: A | Discharge status: Home / Self Care | Payer: 59 | Attending: Emergency Medicine | Admitting: Emergency Medicine

## 2023-04-24 DIAGNOSIS — Z8249 Family history of ischemic heart disease and other diseases of the circulatory system: Secondary | ICD-10-CM | POA: Diagnosis not present

## 2023-04-24 DIAGNOSIS — R0789 Other chest pain: Principal | ICD-10-CM | POA: Insufficient documentation

## 2023-04-24 DIAGNOSIS — R079 Chest pain, unspecified: Principal | ICD-10-CM | POA: Diagnosis present

## 2023-04-24 DIAGNOSIS — Z7985 Long-term (current) use of injectable non-insulin antidiabetic drugs: Secondary | ICD-10-CM | POA: Diagnosis not present

## 2023-04-24 DIAGNOSIS — Z79899 Other long term (current) drug therapy: Secondary | ICD-10-CM | POA: Insufficient documentation

## 2023-04-24 DIAGNOSIS — E876 Hypokalemia: Secondary | ICD-10-CM | POA: Insufficient documentation

## 2023-04-24 DIAGNOSIS — Z91040 Latex allergy status: Secondary | ICD-10-CM | POA: Insufficient documentation

## 2023-04-24 DIAGNOSIS — I2510 Atherosclerotic heart disease of native coronary artery without angina pectoris: Secondary | ICD-10-CM | POA: Insufficient documentation

## 2023-04-24 DIAGNOSIS — Z7982 Long term (current) use of aspirin: Secondary | ICD-10-CM | POA: Insufficient documentation

## 2023-04-24 NOTE — ED Provider Notes (Signed)
[place] EMERGENCY DEPARTMENT AT [facility] Provider Note   CSN: [id number] Arrival date & time: 04/24/23  2318     History  Chief Complaint  Patient presents with   Chest Pain    Substernal chest pain radiating to right arm. Onset tonight. Pt took 3 nitroglycerin tabs and 81 mg ASA. EMS gave 243 mg ASA. Also reports dizziness.      Kelly Clay is a 47 y.o. female.  The history is provided by the patient.  Chest Pain  She had an episode of chest discomfort at about 7 PM.  She describes a heavy feeling in her chest with associated dyspnea and nausea which came on while she was driving.  There was some radiation of discomfort to her left arm.  She took 3 nitroglycerin and pain went away after about 40 minutes.  EMS gave her aspirin and ondansetron.  She feels that she is back at her baseline.  She has been having similar discomfort for the last approximately 10 days and had seen a cardiologist who has scheduled a coronary CT scan for next week.  She is a non-smoker and denies history of hypertension or diabetes or hyperlipidemia.  There is a very strong family history of premature coronary atherosclerosis with multiple family members having had myocardial infarction in their 54s.  She has no history of recent travel or surgery, she is not using exogenous estrogens, there is no prior history of DVT or pulmonary embolism.   Home Medications Prior to Admission medications   Medication Sig Start Date End Date Taking? Authorizing Provider  ALPRAZolam Prudy Feeler) 0.5 MG tablet Take 0.5 mg by mouth 2 (two) times daily as needed for anxiety. 09/29/18  Yes [provider]  amphetamine-dextroamphetamine (ADDERALL XR) 15 MG 24 hr capsule Take 15 mg by mouth every morning. 04/19/23  Yes [provider]  APLENZIN 522 MG TB24 Take 522 mg by mouth daily. 09/29/18  Yes [provider]  aspirin EC 81 MG tablet Take 1 tablet (81 mg total) by mouth daily. Swallow  whole. 04/19/23  Yes Patwardhan, Manish J, MD  cloNIDine (CATAPRES) 0.1 MG tablet Take 0.1 mg by mouth at bedtime.   Yes [provider]  ibuprofen (ADVIL,MOTRIN) 200 MG tablet Take 400 mg by mouth daily as needed for mild pain.   Yes [provider]  lamoTRIgine (LAMICTAL PO) Take by mouth at bedtime.   Yes [provider]  levonorgestrel (MIRENA) 20 MCG/24HR IUD 1 each by Intrauterine route once.   Yes [provider]  nitroGLYCERIN (NITROSTAT) 0.4 MG SL tablet Place 1 tablet (0.4 mg total) under the tongue every 5 (five) minutes as needed. 04/19/23  Yes Patwardhan, Manish J, MD  rosuvastatin (CRESTOR) 10 MG tablet Take 1 tablet (10 mg total) by mouth daily. 04/19/23  Yes Patwardhan, Anabel Bene, MD  Semaglutide,0.25 or 0.5MG /DOS, (OZEMPIC, 0.25 OR 0.5 MG/DOSE,) 2 MG/1.5ML SOPN Inject 0.5 mg into the skin every Wednesday.   Yes [provider]  traZODone (DESYREL) 50 MG tablet Take 50-100 mg by mouth at bedtime as needed for sleep. 07/20/10  Yes [provider]  VYVANSE 30 MG capsule Take 30 mg by mouth daily. 10/02/18  Yes [provider]  ivabradine (CORLANOR) 7.5 MG TABS tablet Take 2 hours prior to Cardiac CT Patient not taking: Reported on 04/25/2023 04/19/23   Elder Negus, MD      Allergies    Sumatriptan, Terbutaline, and Latex    Review of  Systems   Review of Systems  Cardiovascular:  Positive for chest pain.  All other systems reviewed and are negative.   Physical Exam Updated Vital Signs BP 115/76   Pulse 80   Temp 98.4 F (36.9 C) (Oral)   Resp 17   Ht 5\' 2"  (1.575 m)   Wt 82.6 kg   SpO2 95%   BMI 33.29 kg/m  Physical Exam Vitals and nursing note reviewed.   47 year old female, resting comfortably and in no acute distress. Vital signs are normal. Oxygen saturation is 95%, which is normal. Head is normocephalic and atraumatic. PERRLA, EOMI. Oropharynx is clear. Neck is nontender and supple without  adenopathy. Lungs are clear without rales, wheezes, or rhonchi. Chest is nontender. Heart has regular rate and rhythm without murmur. Abdomen is soft, flat, nontender. Extremities have no cyanosis or edema, full range of motion is present. Skin is warm and dry without rash. Neurologic: Mental status is normal, cranial nerves are intact, moves all extremities equally.  ED Results / Procedures / Treatments   Labs (all labs ordered are listed, but only abnormal results are displayed) Labs Reviewed  BASIC METABOLIC PANEL - Abnormal; Notable for the following components:      Result Value   Potassium 3.3 (*)    CO2 20 (*)    All other components within normal limits  CBC WITH DIFFERENTIAL/PLATELET - Abnormal; Notable for the following components:   HCT 35.3 (*)    RDW 11.4 (*)    All other components within normal limits  TROPONIN I (HIGH SENSITIVITY)  TROPONIN I (HIGH SENSITIVITY)    EKG ED ECG REPORT   Date: 04/25/2023  Rate: 79  Rhythm: normal sinus rhythm  QRS Axis: normal  Intervals: normal  ST/T Wave abnormalities: normal  Conduction Disutrbances:none  Narrative Interpretation: Normal ECG.  When compared with ECG of 04/18/2023, no significant changes were seen.  Old EKG Reviewed: unchanged  I have personally reviewed the EKG tracing and agree with the computerized printout as noted.  Radiology DG Chest Port 1 View  Result Date: 04/25/2023 CLINICAL DATA:  Chest pain EXAM: PORTABLE CHEST 1 VIEW COMPARISON:  10/16/2018 FINDINGS: The heart size and mediastinal contours are within normal limits. Both lungs are clear. The visualized skeletal structures are unremarkable. IMPRESSION: No active disease. Electronically Signed   By: Charlett Nose M.D.   On: 04/25/2023 02:10    Procedures Procedures  Cardiac monitor shows normal sinus rhythm, per my interpretation.  Medications Ordered in ED Medications  potassium chloride SA (KLOR-CON M) CR tablet 40 mEq (40 mEq Oral Given  04/25/23 0153)    ED Course/ Medical Decision Making/ A&P             HEART Score: 3                Geneva (Revised) Score: 3, Geneva Score Interpretation: Low Risk Group: 7-9% incidence of pulmonary embolism from several studies PERC Score: 0, PERC Score Interpretation: No need for further workup, as <2% chance of PE.  If no criteria are positive and clinicians pre-test probability is <15%, PERC Rule criteria are satisfied Medical Decision Making Amount and/or Complexity of Data Reviewed Labs: ordered. Radiology: ordered.  Risk Prescription drug management. Decision regarding hospitalization.   Chest discomfort concerning for angina pectoris.  Worsening symptoms are suggestive of possible unstable angina.  I have reviewed her past records and note cardiology office evaluation on 04/19/2023 where there was concern for coronary artery disease and she  was given prescriptions for nitroglycerin and rosuvastatin and she was advised to take daily aspirin and was scheduled for coronary CT scan.  I have ordered electrocardiogram, chest x-ray, troponin x 2.  Pulmonary embolism has been ruled out by [facility] score, PERC score, no need for D-dimer.  I have reviewed her laboratory test, my interpretation is mild hypokalemia and I have ordered a dose of oral potassium, normal CBC, normal troponin x 2.  Chest x-ray shows no active disease.  I have independently viewed the images, and agree with the radiologist's interpretation.  Heart score is 3, which puts her at low risk for major adverse cardiac events in the next 6 weeks.  However, in spite of this, with strong family history of premature coronary atherosclerosis I feel it is not safe to wait to get CT coronary angiogram done.  I have discussed case with Dr. Geraldo Pitter, on-call for cardiology, who agrees to admit the patient and will try to get CT coronary angiogram on an expedited basis.  Final Clinical Impression(s) / ED Diagnoses Final diagnoses:   Nonspecific chest pain  Hypokalemia    Rx / DC Orders ED Discharge Orders     None         Dione Booze, MD 04/25/23 [id number]

## 2023-04-24 NOTE — ED Notes (Signed)
ED Provider at bedside. 

## 2023-04-24 NOTE — ED Provider Notes (Incomplete)
[place] EMERGENCY DEPARTMENT AT [facility] Provider Note   CSN: [id number] Arrival date & time: 04/24/23  2318     History {Add pertinent medical, surgical, social history, OB history to HPI:1} Chief Complaint  Patient presents with  . Chest Pain    Substernal chest pain radiating to right arm. Onset tonight. Pt took 3 nitroglycerin tabs and 81 mg ASA. EMS gave 243 mg ASA. Also reports dizziness.      Kelly Clay is a 47 y.o. female.  The history is provided by the patient.  Chest Pain  She had an episode of chest discomfort at about 7 PM.  She describes a heavy feeling in her chest with associated dyspnea and nausea which came on while she was driving.  There was some radiation of discomfort to her left arm.  She took 3 nitroglycerin and pain went away after about 40 minutes.  EMS gave her aspirin and ondansetron.  She feels that she is back at her baseline.  She has been having similar discomfort for the last approximately 10 days and had seen a cardiologist who has scheduled a coronary CT scan for next week.  She is a non-smoker and denies history of hypertension or diabetes or hyperlipidemia.  There is a very strong family history of premature coronary atherosclerosis with multiple family members having had myocardial infarction in their 80s.  She has no history of recent travel or surgery, she is not using exogenous estrogens, there is no prior history of DVT or pulmonary embolism.   Home Medications Prior to Admission medications   Medication Sig Start Date End Date Taking? Authorizing Provider  ALPRAZolam Prudy Feeler) 0.5 MG tablet Take 0.5 mg by mouth 2 (two) times daily as needed for anxiety. 09/29/18   [provider]  amphetamine-dextroamphetamine (ADDERALL XR) 15 MG 24 hr capsule Take 15 mg by mouth every morning. 04/19/23   [provider]  APLENZIN 522 MG TB24 Take 522 mg by mouth daily. 09/29/18   [provider]  aspirin EC 81 MG  tablet Take 1 tablet (81 mg total) by mouth daily. Swallow whole. 04/19/23   Patwardhan, Anabel Bene, MD  azelastine (ASTELIN) 0.1 % nasal spray azelastine 137 mcg (0.1 %) nasal spray aerosol    [provider]  benzonatate (TESSALON) 200 MG capsule benzonatate 200 mg capsule    [provider]  cloNIDine (CATAPRES) 0.1 MG tablet clonidine HCl 0.1 mg tablet    [provider]  diphenhydrAMINE HCl (BENADRYL PO) Take 1 tablet by mouth daily.    [provider]  fexofenadine (ALLEGRA) 180 MG tablet Take 180 mg by mouth daily.    [provider]  ibuprofen (ADVIL,MOTRIN) 200 MG tablet Take 400 mg by mouth daily as needed for mild pain.    [provider]  ivabradine (CORLANOR) 7.5 MG TABS tablet Take 2 hours prior to Cardiac CT 04/19/23   Patwardhan, Anabel Bene, MD  levonorgestrel (MIRENA) 20 MCG/24HR IUD 1 each by Intrauterine route once.    [provider]  nitroGLYCERIN (NITROSTAT) 0.4 MG SL tablet Place 1 tablet (0.4 mg total) under the tongue every 5 (five) minutes as needed. 04/19/23   Patwardhan, Anabel Bene, MD  rosuvastatin (CRESTOR) 10 MG tablet Take 1 tablet (10 mg total) by mouth daily. 04/19/23   Patwardhan, Anabel Bene, MD  Semaglutide,0.25 or 0.5MG /DOS, (OZEMPIC, 0.25 OR 0.5 MG/DOSE,) 2 MG/1.5ML SOPN Inject into the skin once a week.    [provider]  traZODone (DESYREL) 50 MG tablet Take 50-100 mg by mouth at bedtime as needed for sleep. 07/20/10   [provider]  VYVANSE 30 MG capsule  10/02/18   [provider]      Allergies    Sumatriptan, Terbutaline, and Latex    Review of Systems   Review of Systems  Cardiovascular:  Positive for chest pain.  All other systems reviewed and are negative.   Physical Exam Updated Vital Signs Temp 98.4 F (36.9 C) (Oral)   Ht 5\' 2"  (1.575 m)   Wt 82.6 kg   BMI 33.29 kg/m  Physical Exam Vitals and nursing note reviewed.   47 year old female, resting  comfortably and in no acute distress. Vital signs are ***. Oxygen saturation is ***%, which is normal. Head is normocephalic and atraumatic. PERRLA, EOMI. Oropharynx is clear. Neck is nontender and supple without adenopathy. Lungs are clear without rales, wheezes, or rhonchi. Chest is nontender. Heart has regular rate and rhythm without murmur. Abdomen is soft, flat, nontender. Extremities have no cyanosis or edema, full range of motion is present. Skin is warm and dry without rash. Neurologic: Mental status is normal, cranial nerves are intact, moves all extremities equally.  ED Results / Procedures / Treatments   Labs (all labs ordered are listed, but only abnormal results are displayed) Labs Reviewed - No data to display  EKG None  Radiology No results found.  Procedures Procedures  Cardiac monitor shows normal sinus rhythm, per my interpretation.  Medications Ordered in ED Medications - No data to display  ED Course/ Medical Decision Making/ A&P   {   Click here for ABCD2, HEART and other calculatorsREFRESH Note before signing :1}                              Medical Decision Making  Chest discomfort concerning for angina pectoris.  Worsening symptoms are suggestive of possible unstable angina.  I have reviewed her past records and note cardiology office evaluation on 04/19/2023 where there was concern for coronary artery disease and she was given prescriptions for nitroglycerin and rosuvastatin and she was advised to take daily aspirin and was scheduled for coronary CT scan.  {Document critical care time when appropriate:1} {Document review of labs and clinical decision tools ie heart score, Chads2Vasc2 etc:1}  {Document your independent review of radiology images, and any outside records:1} {Document your discussion with family members, caretakers, and with consultants:1} {Document social determinants of health affecting pt's care:1} {Document your decision making why  or why not admission, treatments were needed:1} Final Clinical Impression(s) / ED Diagnoses Final diagnoses:  None    Rx / DC Orders ED Discharge Orders     None

## 2023-04-25 ENCOUNTER — Emergency Department (HOSPITAL_COMMUNITY): Payer: 59

## 2023-04-25 ENCOUNTER — Other Ambulatory Visit: Payer: Self-pay

## 2023-04-25 DIAGNOSIS — R079 Chest pain, unspecified: Secondary | ICD-10-CM | POA: Diagnosis not present

## 2023-04-25 LAB — CBC WITH DIFFERENTIAL/PLATELET
Abs Immature Granulocytes: 0.01 10*3/uL (ref 0.00–0.07)
Basophils Absolute: 0 10*3/uL (ref 0.0–0.1)
Basophils Relative: 0 %
Eosinophils Absolute: 0.1 10*3/uL (ref 0.0–0.5)
Eosinophils Relative: 1 %
HCT: 35.3 % — ABNORMAL LOW (ref 36.0–46.0)
Hemoglobin: 12.2 g/dL (ref 12.0–15.0)
Immature Granulocytes: 0 %
Lymphocytes Relative: 32 %
Lymphs Abs: 2.7 10*3/uL (ref 0.7–4.0)
MCH: 30.6 pg (ref 26.0–34.0)
MCHC: 34.6 g/dL (ref 30.0–36.0)
MCV: 88.5 fL (ref 80.0–100.0)
Monocytes Absolute: 0.5 10*3/uL (ref 0.1–1.0)
Monocytes Relative: 6 %
Neutro Abs: 5.1 10*3/uL (ref 1.7–7.7)
Neutrophils Relative %: 61 %
Platelets: 282 10*3/uL (ref 150–400)
RBC: 3.99 MIL/uL (ref 3.87–5.11)
RDW: 11.4 % — ABNORMAL LOW (ref 11.5–15.5)
WBC: 8.4 10*3/uL (ref 4.0–10.5)
nRBC: 0 % (ref 0.0–0.2)

## 2023-04-25 LAB — BASIC METABOLIC PANEL
Anion gap: 10 (ref 5–15)
BUN: 9 mg/dL (ref 6–20)
CO2: 20 mmol/L — ABNORMAL LOW (ref 22–32)
Calcium: 8.9 mg/dL (ref 8.9–10.3)
Chloride: 108 mmol/L (ref 98–111)
Creatinine, Ser: 0.76 mg/dL (ref 0.44–1.00)
GFR, Estimated: >60 mL/min (ref >60)
Glucose, Bld: 78 mg/dL (ref 70–99)
Potassium: 3.3 mmol/L — ABNORMAL LOW (ref 3.5–5.1)
Sodium: 138 mmol/L (ref 135–145)

## 2023-04-25 LAB — TROPONIN I (HIGH SENSITIVITY)
Troponin I (High Sensitivity): <2 ng/L (ref <18)
Troponin I (High Sensitivity): <2 ng/L (ref <18)

## 2023-04-25 LAB — HIV ANTIBODY (ROUTINE TESTING W REFLEX): HIV Screen 4th Generation wRfx: NONREACTIVE

## 2023-04-25 MED ORDER — NITROGLYCERIN 0.4 MG SL SUBL: 0.4000 mg | SUBLINGUAL_TABLET | SUBLINGUAL | Status: DC | PRN

## 2023-04-25 MED ORDER — METOPROLOL TARTRATE 25 MG PO TABS
50.0000 mg | ORAL_TABLET | Freq: Once | ORAL | Status: AC
  Administered 2023-04-25: 50 mg via ORAL
  Filled 2023-04-25: qty 2

## 2023-04-25 MED ORDER — ROSUVASTATIN CALCIUM 5 MG PO TABS
10.0000 mg | ORAL_TABLET | Freq: Every day | ORAL | Status: DC
  Administered 2023-04-25 – 2023-04-26 (×2): 10 mg via ORAL
  Filled 2023-04-25 (×3): qty 2

## 2023-04-25 MED ORDER — ASPIRIN 81 MG PO TBEC
81.0000 mg | DELAYED_RELEASE_TABLET | Freq: Every day | ORAL | Status: DC
  Administered 2023-04-25: 81 mg via ORAL
  Filled 2023-04-25: qty 1

## 2023-04-25 MED ORDER — CLONIDINE HCL 0.1 MG PO TABS
0.1000 mg | ORAL_TABLET | Freq: Every day | ORAL | Status: DC
  Administered 2023-04-25: 0.1 mg via ORAL
  Filled 2023-04-25: qty 1

## 2023-04-25 MED ORDER — BUPROPION HBR ER 522 MG PO TB24: 522.0000 mg | ORAL_TABLET | Freq: Every day | ORAL | Status: DC

## 2023-04-25 MED ORDER — TRAZODONE HCL 50 MG PO TABS
50.0000 mg | ORAL_TABLET | Freq: Every evening | ORAL | Status: DC | PRN
  Administered 2023-04-25: 100 mg via ORAL
  Filled 2023-04-25: qty 2

## 2023-04-25 MED ORDER — POTASSIUM CHLORIDE CRYS ER 20 MEQ PO TBCR
40.0000 meq | EXTENDED_RELEASE_TABLET | Freq: Once | ORAL | Status: AC
  Administered 2023-04-25: 40 meq via ORAL
  Filled 2023-04-25: qty 2

## 2023-04-25 MED ORDER — METOPROLOL TARTRATE 50 MG PO TABS
75.0000 mg | ORAL_TABLET | Freq: Once | ORAL | Status: AC
  Administered 2023-04-26: 75 mg via ORAL
  Filled 2023-04-25: qty 1

## 2023-04-25 NOTE — ED Notes (Signed)
Xray tech at bedside at this time

## 2023-04-25 NOTE — ED Notes (Signed)
ED TO INPATIENT HANDOFF REPORT  ED Nurse Name and Phone #: Vernona Rieger [phone]  S Name/Age/Gender Kelly Clay 47 y.o. female Room/Bed: 044C/044C  Code Status   Code Status: Full Code  Home/SNF/Other Home Patient oriented to: self, place, time, and situation Is this baseline? Yes   Triage Complete: Triage complete  Chief Complaint Chest pain [R07.9]  Triage Note No notes on file   Allergies Allergies  Allergen Reactions   Sumatriptan Anaphylaxis, Swelling and Other (See Comments)    In throat. Numbness/tingling in body Other reaction(s): Respiratory Distress   Terbutaline     hypotension   Latex Itching and Rash    Level of Care/Admitting Diagnosis ED Disposition     ED Disposition  Admit   Condition  --   Comment  Hospital Area: [facility] [[id number]]  Level of Care: Progressive [102]  Admit to Progressive based on following criteria: CARDIOVASCULAR & THORACIC of moderate stability with acute coronary syndrome symptoms/low risk myocardial infarction/hypertensive urgency/arrhythmias/heart failure potentially compromising stability and stable post cardiovascular intervention patients.  May place patient in observation at [facility] or [facility] if equivalent level of care is available:: No  Covid Evaluation: Asymptomatic - no recent exposure (last 10 days) testing not required  Diagnosis: Chest pain [629528]  Admitting Physician: Aundra Dubin [[id number]]  Attending Physician: Aundra Dubin [[id number]]          B Medical/Surgery History Past Medical History:  Diagnosis Date   Abnormal Pap smear    colpo   AMA (advanced maternal age) multigravida 35+    Anxiety    Complication of anesthesia    epidurals did not take   Depression    H/O hiatal hernia    Headache(784.0)    Preterm labor    induced at 21wks, PIH   Urinary tract infection    Past Surgical History:  Procedure Laterality Date   CESAREAN SECTION  07/26/2012    Procedure: CESAREAN SECTION;  Surgeon: Lavina Hamman, MD;  Location: [facility];  Service: Obstetrics;  Laterality: N/A;  Primary Cesarean Section Delivery Baby Girl @ [phone], Apgars 9/9   DILATION AND CURETTAGE OF UTERUS     TONSILLECTOMY       A IV Location/Drains/Wounds Patient Lines/Drains/Airways Status     Active Line/Drains/Airways     Name Placement date Placement time Site Days   Peripheral IV 04/24/23 20 G Right Antecubital 04/24/23  --  Antecubital  1   Peripheral IV 04/25/23 18 G Anterior;Left;Proximal Forearm 04/25/23  1228  Forearm  less than 1            Intake/Output Last 24 hours No intake or output data in the 24 hours ending 04/25/23 1228  Labs/Imaging Results for orders placed or performed during the hospital encounter of 04/24/23 (from the past 48 hour(s))  Basic metabolic panel     Status: Abnormal   Collection Time: 04/25/23 12:20 AM  Result Value Ref Range   Sodium 138 135 - 145 mmol/L   Potassium 3.3 (L) 3.5 - 5.1 mmol/L   Chloride 108 98 - 111 mmol/L   CO2 20 (L) 22 - 32 mmol/L   Glucose, Bld 78 70 - 99 mg/dL    Comment: Glucose reference range applies only to samples taken after fasting for at least 8 hours.   BUN 9 6 - 20 mg/dL   Creatinine, Ser 0.34 0.44 - 1.00 mg/dL   Calcium 8.9 8.9 - 74.2 mg/dL   GFR, Estimated >59 >  60 mL/min    Comment: (NOTE) Calculated using the CKD-EPI Creatinine Equation (2021)    Anion gap 10 5 - 15    Comment: Performed at [facility], [address], [place], [place] [address]  Troponin I (High Sensitivity)     Status: None   Collection Time: 04/25/23 12:20 AM  Result Value Ref Range   Troponin I (High Sensitivity) <2 <18 ng/L    Comment: (NOTE) Elevated high sensitivity troponin I (hsTnI) values and significant  changes across serial measurements may suggest ACS but many other  chronic and acute conditions are known to elevate hsTnI results.  Refer to the "Links" section for chest pain algorithms and  additional  guidance. Performed at [facility], [address] [place], [place] [address]   CBC with Differential     Status: Abnormal   Collection Time: 04/25/23 12:20 AM  Result Value Ref Range   WBC 8.4 4.0 - 10.5 K/uL   RBC 3.99 3.87 - 5.11 MIL/uL   Hemoglobin 12.2 12.0 - 15.0 g/dL   HCT 01.0 (L) 93.2 - 35.5 %   MCV 88.5 80.0 - 100.0 fL   MCH 30.6 26.0 - 34.0 pg   MCHC 34.6 30.0 - 36.0 g/dL   RDW 73.2 (L) 20.2 - 54.2 %   Platelets 282 150 - 400 K/uL   nRBC 0.0 0.0 - 0.2 %   Neutrophils Relative % 61 %   Neutro Abs 5.1 1.7 - 7.7 K/uL   Lymphocytes Relative 32 %   Lymphs Abs 2.7 0.7 - 4.0 K/uL   Monocytes Relative 6 %   Monocytes Absolute 0.5 0.1 - 1.0 K/uL   Eosinophils Relative 1 %   Eosinophils Absolute 0.1 0.0 - 0.5 K/uL   Basophils Relative 0 %   Basophils Absolute 0.0 0.0 - 0.1 K/uL   Immature Granulocytes 0 %   Abs Immature Granulocytes 0.01 0.00 - 0.07 K/uL    Comment: Performed at [facility], [address], [place], [place] [address]  Troponin I (High Sensitivity)     Status: None   Collection Time: 04/25/23  1:57 AM  Result Value Ref Range   Troponin I (High Sensitivity) <2 <18 ng/L    Comment: (NOTE) Elevated high sensitivity troponin I (hsTnI) values and significant  changes across serial measurements may suggest ACS but many other  chronic and acute conditions are known to elevate hsTnI results.  Refer to the "Links" section for chest pain algorithms and additional  guidance. Performed at [facility], [address], [place], [place] [address]   HIV Antibody (routine testing w rflx)     Status: None   Collection Time: 04/25/23  5:14 AM  Result Value Ref Range   HIV Screen 4th Generation wRfx Non Reactive Non Reactive    Comment: Performed at [facility], [address], [place], [place] [address]   DG Chest Port 1 View  Result Date: 04/25/2023 CLINICAL DATA:  Chest pain EXAM: PORTABLE CHEST 1 VIEW COMPARISON:   10/16/2018 FINDINGS: The heart size and mediastinal contours are within normal limits. Both lungs are clear. The visualized skeletal structures are unremarkable. IMPRESSION: No active disease. Electronically Signed   By: Charlett Nose M.D.   On: 04/25/2023 02:10    Pending Labs Unresulted Labs (From admission, onward)     Start     Ordered   04/25/23 0431  Lipoprotein A (LPA)  Once,   R  04/25/23 0430            Vitals/Pain Today's Vitals   04/25/23 0645 04/25/23 0730 04/25/23 1033 04/25/23 1126  BP: 122/84  120/76   Pulse: 78  82   Resp: 15  18   Temp:  97.6 F (36.4 C)  97.7 F (36.5 C)  TempSrc:    Oral  SpO2: 95%  100%   Weight:      Height:      PainSc:        Isolation Precautions No active isolations  Medications Medications  aspirin EC tablet 81 mg (81 mg Oral Given 04/25/23 1034)  cloNIDine (CATAPRES) tablet 0.1 mg (has no administration in time range)  nitroGLYCERIN (NITROSTAT) SL tablet 0.4 mg (has no administration in time range)  rosuvastatin (CRESTOR) tablet 10 mg (10 mg Oral Given 04/25/23 1034)  BuPROPion HBr TB24 522 mg (522 mg Oral Not Given 04/25/23 1041)  traZODone (DESYREL) tablet 50-100 mg (has no administration in time range)  potassium chloride SA (KLOR-CON M) CR tablet 40 mEq (40 mEq Oral Given 04/25/23 0153)  metoprolol tartrate (LOPRESSOR) tablet 50 mg (50 mg Oral Given 04/25/23 1219)    Mobility walks     Focused Assessments Cardiac Assessment Handoff:  Cardiac Rhythm: Normal sinus rhythm Lab Results  Component Value Date   TROPONINI <0.03 10/16/2018   Lab Results  Component Value Date   DDIMER 0.36 10/16/2018   Does the Patient currently have chest pain? No    R Recommendations: See Admitting Provider Note  Report given to:   Additional Notes: NPO since yesterday

## 2023-04-25 NOTE — ED Notes (Signed)
ED Provider back at bedside. 

## 2023-04-25 NOTE — Progress Notes (Signed)
New Admission Note:   Arrival Method: wheelchair  Mental Orientation:  Telemetry:  Assessment: Completed Skin: intact  IV: Pain: 5/10   Tubes: none  Safety Measures: Safety Fall Prevention Plan has been given, discussed and signed Admission: Completed Orientation: Patient has been orientated to the room, unit and staff.  Family: none   Orders have been reviewed and implemented. Will continue to monitor the patient. Call light has been placed within reach and bed alarm has been activated.   Norman Clay RN [facility]  Phone: [phone]

## 2023-04-25 NOTE — Plan of Care (Signed)
  Problem: Education: Goal: Knowledge of General Education information will improve Description Including pain rating scale, medication(s)/side effects and non-pharmacologic comfort measures Outcome: Progressing   

## 2023-04-25 NOTE — H&P (Addendum)
Cardiology Admission History and Physical   Patient ID: Kelly Clay MRN: [id number]; DOB: [birth year]   Admission date: 04/24/2023  PCP:  Myrlene Broker, MD   [facility] Providers Cardiologist:  Elder Negus, MD        Chief Complaint:  chest pain  Patient Profile:   Kelly Clay is a 47 y.o. female with anxiety and depression and early family history of CAD who is being seen 04/25/2023 for the evaluation of chest pain.  History of Present Illness:   Kelly Clay was in her usual state of health until about 1 week ago on Sunday 10/6.  She was driving in her car when she noticed acute onset substernal chest pain that she described as heart squeezing.  Intense pain lasted for about 3-4 minutes then slowly started to dissipate however she continued to have chest pressure.  She then developed left-sided neck pain and left arm numbness.  She thinks symptoms lasted for a total of 20-30 minutes.  Had associated nausea and lightheadedness.  Did not seek medical care at that time.  Ultimately had a cardiology visit with Dr. Rosemary Holms on 04/19/2023.  CT coronary angiogram was recommended.  She was started on aspirin and statin and sublingual nitroglycerin as needed.  Labs were obtained the day prior to that visit including an unremarkable CBC, CMP, TSH, high-sensitivity troponin x 1, BNP.  On 10/14, when driving home from her daughter soccer game, she again had recurrence of chest discomfort symptoms that she described as heart squeezing.  She had associated difficulty catching her breath (shortness of breath) which was bothersome for her.  She once again had radiation to the neck and left arm numbness.  When she got home, she took a shower hoping that this would help her symptoms.  Her symptoms persisted and increased resulting in her becoming more anxious.  We also noted that her systolic blood pressure was in the 130s-140s, which is elevated for her.  She  took 3 sublingual nitroglycerin, each of which resulted in temporary improvement of the pain/discomfort though the pain would recur, prompting her to call EMS.  She was given 3, 81 mg aspirin with EMS.  Endorses a significant family history of early CAD, in particular her mother who was diagnosed with coronary artery disease in her 59s.  She is a non-smoker.  Works as a Scientist, research (physical sciences), mainly sedentary.  Has been fatigued for multiple months now with not much exercise on a routine basis.  On presentation to the emergency room, afebrile, HR 70s-80s, BP 110s/80s, no supplemental O2 requirement.  CBC and BMP unremarkable.  High-sensitivity troponins negative x 2.  CXR unremarkable.  EKG unremarkable.  No longer having chest discomfort symptoms.  Past Medical History:  Diagnosis Date   Abnormal Pap smear    colpo   AMA (advanced maternal age) multigravida 35+    Anxiety    Complication of anesthesia    epidurals did not take   Depression    H/O hiatal hernia    Headache(784.0)    Preterm labor    induced at 70wks, PIH   Urinary tract infection     Past Surgical History:  Procedure Laterality Date   CESAREAN SECTION  07/26/2012   Procedure: CESAREAN SECTION;  Surgeon: Lavina Hamman, MD;  Location: [facility];  Service: Obstetrics;  Laterality: N/A;  Primary Cesarean Section Delivery Baby Girl @ [phone], Apgars 9/9   DILATION AND CURETTAGE OF UTERUS  TONSILLECTOMY       Medications Prior to Admission: Prior to Admission medications   Medication Sig Start Date End Date Taking? Authorizing Provider  ALPRAZolam Prudy Feeler) 0.5 MG tablet Take 0.5 mg by mouth 2 (two) times daily as needed for anxiety. 09/29/18  Yes [provider]  amphetamine-dextroamphetamine (ADDERALL XR) 15 MG 24 hr capsule Take 15 mg by mouth every morning. 04/19/23  Yes [provider]  APLENZIN 522 MG TB24 Take 522 mg by mouth daily. 09/29/18  Yes [provider]  aspirin EC 81 MG tablet Take 1  tablet (81 mg total) by mouth daily. Swallow whole. 04/19/23  Yes Patwardhan, Manish J, MD  cloNIDine (CATAPRES) 0.1 MG tablet Take 0.1 mg by mouth at bedtime.   Yes [provider]  ibuprofen (ADVIL,MOTRIN) 200 MG tablet Take 400 mg by mouth daily as needed for mild pain.   Yes [provider]  lamoTRIgine (LAMICTAL PO) Take by mouth at bedtime.   Yes [provider]  levonorgestrel (MIRENA) 20 MCG/24HR IUD 1 each by Intrauterine route once.   Yes [provider]  nitroGLYCERIN (NITROSTAT) 0.4 MG SL tablet Place 1 tablet (0.4 mg total) under the tongue every 5 (five) minutes as needed. 04/19/23  Yes Patwardhan, Manish J, MD  rosuvastatin (CRESTOR) 10 MG tablet Take 1 tablet (10 mg total) by mouth daily. 04/19/23  Yes Patwardhan, Anabel Bene, MD  Semaglutide,0.25 or 0.5MG /DOS, (OZEMPIC, 0.25 OR 0.5 MG/DOSE,) 2 MG/1.5ML SOPN Inject 0.5 mg into the skin every Wednesday.   Yes [provider]  traZODone (DESYREL) 50 MG tablet Take 50-100 mg by mouth at bedtime as needed for sleep. 07/20/10  Yes [provider]  VYVANSE 30 MG capsule Take 30 mg by mouth daily. 10/02/18  Yes [provider]  ivabradine (CORLANOR) 7.5 MG TABS tablet Take 2 hours prior to Cardiac CT Patient not taking: Reported on 04/25/2023 04/19/23   Elder Negus, MD     Allergies:    Allergies  Allergen Reactions   Sumatriptan Anaphylaxis, Swelling and Other (See Comments)    In throat. Numbness/tingling in body Other reaction(s): Respiratory Distress   Terbutaline     hypotension   Latex Itching and Rash    Social History:   Social History   Socioeconomic History   Marital status: Divorced    Spouse name: Not on file   Number of children: Not on file   Years of education: Not on file   Highest education level: Not on file  Occupational History   Not on file  Tobacco Use   Smoking status: Never   Smokeless tobacco: Never  Substance and Sexual Activity    Alcohol use: Yes    Comment: social   Drug use: No   Sexual activity: Yes  Other Topics Concern   Not on file  Social History Narrative   Not on file   Social Determinants of Health   Financial Resource Strain: Not on file  Food Insecurity: Not on file  Transportation Needs: Not on file  Physical Activity: Not on file  Stress: Not on file  Social Connections: Not on file  Intimate Partner Violence: Not on file    Family History:   The patient's family history includes Birth defects in her son; Breast cancer in her maternal grandmother; COPD in her mother; Cancer in her maternal grandmother; Diabetes in her father; Heart disease in her mother; Hypertension in her father and mother; Thyroid disease in her mother. There is  no history of Other.    ROS:  Please see the history of present illness.  All other ROS reviewed and negative.     Physical Exam/Data:   Vitals:   04/25/23 0312 04/25/23 0313 04/25/23 0314 04/25/23 0315  BP:      Pulse: 82 79 79 80  Resp: 20 14 15 17   Temp:      TempSrc:      SpO2: 97% 96% 95% 95%  Weight:      Height:       No intake or output data in the 24 hours ending 04/25/23 0412    04/24/2023   11:24 PM 04/19/2023    3:45 PM 04/18/2023   10:18 AM  Last 3 Weights  Weight (lbs) 182 lb 186 lb 186 lb  Weight (kg) 82.555 kg 84.369 kg 84.369 kg     Body mass index is 33.29 kg/m.  General:  Well nourished, well developed, in no acute distress HEENT: normal Neck: no JVD Vascular: No carotid bruits; Distal pulses 2+ bilaterally   Cardiac:  normal S1, S2; RRR; no murmur  Lungs:  clear to auscultation bilaterally, no wheezing, rhonchi or rales  Abd: soft, nontender, no hepatomegaly  Ext: no edema Musculoskeletal:  No deformities, BUE and BLE strength normal and equal Skin: warm and dry  Neuro:  CNs 2-12 intact, no focal abnormalities noted Psych:  Normal affect    EKG:  The ECG that was done 10/15 was personally reviewed and demonstrates  normal sinus rhythm without ischemic ST segment or T wave changes  Relevant CV Studies: none  Laboratory Data:  High Sensitivity Troponin:   Recent Labs  Lab 04/25/23 0020 04/25/23 0157  TROPONINIHS <2 <2      Chemistry Recent Labs  Lab 04/18/23 1047 04/25/23 0020  NA 139 138  K 4.5 3.3*  CL 109 108  CO2 24 20*  GLUCOSE 80 78  BUN 9 9  CREATININE 0.76 0.76  CALCIUM 9.0 8.9  GFRNONAA  --  >60  ANIONGAP  --  10    Recent Labs  Lab 04/18/23 1047  PROT 6.6  ALBUMIN 4.1  AST 12  ALT 5  ALKPHOS 38*  BILITOT 0.4   Lipids No results for input(s): "CHOL", "TRIG", "HDL", "LABVLDL", "LDLCALC", "CHOLHDL" in the last 168 hours. Hematology Recent Labs  Lab 04/18/23 1047 04/25/23 0020  WBC 5.1 8.4  RBC 4.19 3.99  HGB 12.8 12.2  HCT 37.8 35.3*  MCV 90.1 88.5  MCH  --  30.6  MCHC 33.9 34.6  RDW 12.7 11.4*  PLT 317.0 282   Thyroid  Recent Labs  Lab 04/18/23 1047  TSH 2.61   BNP Recent Labs  Lab 04/18/23 1047  PROBNP 45.0    DDimer No results for input(s): "DDIMER" in the last 168 hours.   Radiology/Studies:  DG Chest Port 1 View  Result Date: 04/25/2023 CLINICAL DATA:  Chest pain EXAM: PORTABLE CHEST 1 VIEW COMPARISON:  10/16/2018 FINDINGS: The heart size and mediastinal contours are within normal limits. Both lungs are clear. The visualized skeletal structures are unremarkable. IMPRESSION: No active disease. Electronically Signed   By: Charlett Nose M.D.   On: 04/25/2023 02:10     Assessment and Plan:   Acute chest pain, suspect cardiac chest pain Presenting with second recurrence of chest pressure/pain with radiation to the jaw and arm numbness.  Symptoms occurring at rest and now more severe with second episode.  Has known family history of early CAD.  Otherwise, no other significant risk factors for obstructive coronary disease.  Her troponins are negative x 2 and she is symptom-free at this time.  Her story though is concerning for coronary related  chest pain.  While plaque related stenosis is possible, other considerations include coronary artery spasm, myocardial bridging and microvascular dysfunction.  No other obvious explainable etiology of her acute chest discomfort symptoms.  Low concern for PE or aortic dissection (nontachycardic, normal blood pressure).  Anxiety may also be playing a role though is a diagnosis of exclusion.  No current signs or symptoms of HF. - CT coronary angiogram - continue home aspirin and statin for now pending results - echo pending results - EKG with any recurrence of chest pain  Anxiety Continue home regimen including: Clonidine 0.1 mg nightly, trazodone nightly as needed, bupropion  Risk Assessment/Risk Scores:          Code Status: Full Code  Severity of Illness: The appropriate patient status for this patient is INPATIENT. Inpatient status is judged to be reasonable and necessary in order to provide the required intensity of service to ensure the patient's safety. The patient's presenting symptoms, physical exam findings, and initial radiographic and laboratory data in the context of their chronic comorbidities is felt to place them at high risk for further clinical deterioration. Furthermore, it is not anticipated that the patient will be medically stable for discharge from the hospital within 2 midnights of admission.   * I certify that at the point of admission it is my clinical judgment that the patient will require inpatient hospital care spanning beyond 2 midnights from the point of admission due to high intensity of service, high risk for further deterioration and high frequency of surveillance required.*   For questions or updates, please contact [facility] Please consult [email or web address] for contact info under     Signed, Aundra Dubin, MD  04/25/2023 4:12 AM     Patient seen and examined. Agree with assessment and plan as outlined by Dr. Geraldo Pitter.  Patinet admits to mild  chest soreness. no JVD, nontender to palpation over chest wall.Lun. lungs clear, RRR no s3 or s4, rub thrills or heaves. Abd nontender, no edema.  Agree with plan for Coronary CTA today   Lennette Bihari, MD, [facility] 04/25/2023 11:52 AM

## 2023-04-25 NOTE — ED Notes (Signed)
Assumed care of patient.

## 2023-04-25 NOTE — ED Notes (Signed)
Contacted Dr. Geraldo Pitter via secure chat per patient's request for regular home medications.

## 2023-04-26 ENCOUNTER — Observation Stay (HOSPITAL_BASED_OUTPATIENT_CLINIC_OR_DEPARTMENT_OTHER): Payer: 59

## 2023-04-26 DIAGNOSIS — R072 Precordial pain: Secondary | ICD-10-CM | POA: Diagnosis not present

## 2023-04-26 LAB — BASIC METABOLIC PANEL
Anion gap: 10 (ref 5–15)
BUN: 13 mg/dL (ref 6–20)
CO2: 23 mmol/L (ref 22–32)
Calcium: 9.4 mg/dL (ref 8.9–10.3)
Chloride: 104 mmol/L (ref 98–111)
Creatinine, Ser: 0.81 mg/dL (ref 0.44–1.00)
GFR, Estimated: >60 mL/min (ref >60)
Glucose, Bld: 80 mg/dL (ref 70–99)
Potassium: 4.5 mmol/L (ref 3.5–5.1)
Sodium: 137 mmol/L (ref 135–145)

## 2023-04-26 LAB — LIPOPROTEIN A (LPA): Lipoprotein (a): 200.6 nmol/L — ABNORMAL HIGH (ref <75.0)

## 2023-04-26 MED ORDER — NITROGLYCERIN 0.4 MG SL SUBL
SUBLINGUAL_TABLET | SUBLINGUAL | Status: AC
  Filled 2023-04-26: qty 2

## 2023-04-26 MED ORDER — IOHEXOL 350 MG/ML SOLN
95.0000 mL | Freq: Once | INTRAVENOUS | Status: AC | PRN
  Administered 2023-04-26: 95 mL via INTRAVENOUS

## 2023-04-26 MED ORDER — NITROGLYCERIN 0.4 MG SL SUBL
0.8000 mg | SUBLINGUAL_TABLET | Freq: Once | SUBLINGUAL | Status: AC
  Administered 2023-04-26: 0.8 mg via SUBLINGUAL

## 2023-04-26 NOTE — Plan of Care (Signed)
Problem: Activity: Goal: Risk for activity intolerance will decrease Outcome: Progressing   Problem: Nutrition: Goal: Adequate nutrition will be maintained Outcome: Progressing   Problem: Coping: Goal: Level of anxiety will decrease Outcome: Progressing   Problem: Elimination: Goal: Will not experience complications related to urinary retention Outcome: Progressing

## 2023-04-26 NOTE — Discharge Summary (Signed)
Discharge Summary    Patient ID: Kelly Clay MRN: [id number]; DOB: Oct 25, 1975  Admit date: 04/24/2023 Discharge date: 04/26/2023  PCP:  Myrlene Broker, MD   [facility] Providers Cardiologist:  Kelly Negus, MD      Discharge Diagnoses    Principal Problem:   Acute chest pain   Diagnostic Studies/Procedures    CCTa _____________   History of Present Illness     Kelly Clay is a 47 y.o. female with anxiety and depression and early family history of CAD who was seen 04/25/2023 for the evaluation of chest pain. Ms. Twining was in her usual state of health until about 1 week ago on Sunday 10/6.  She was driving in her car when she noticed acute onset substernal chest pain that she described as heart squeezing.  Intense pain lasted for about 3-4 minutes then slowly started to dissipate however she continued to have chest pressure.  She then developed left-sided neck pain and left arm numbness.  She thinks symptoms lasted for a total of 20-30 minutes.  Had associated nausea and lightheadedness.  Did not seek medical care at that time.   Ultimately had a cardiology visit with Dr. Rosemary Clay on 04/19/2023.  CT coronary angiogram was recommended.  She was started on aspirin and statin and sublingual nitroglycerin as needed.  Labs were obtained the day prior to that visit including an unremarkable CBC, CMP, TSH, high-sensitivity troponin x 1, BNP.   On 10/14, when driving home from her daughter soccer game, she again had recurrence of chest discomfort symptoms that she described as heart squeezing.  She had associated difficulty catching her breath (shortness of breath) which was bothersome for her.  She once again had radiation to the neck and left arm numbness.  When she got home, she took a shower hoping that this would help her symptoms.  Her symptoms persisted and increased resulting in her becoming more anxious.  We also noted that her systolic blood  pressure was in the 130s-140s, which is elevated for her.  She took 3 sublingual nitroglycerin, each of which resulted in temporary improvement of the pain/discomfort though the pain would recur, prompting her to call EMS.  She was given 3, 81 mg aspirin with EMS.   Endorses a significant family history of early CAD, in particular her mother who was diagnosed with coronary artery disease in her 34s.  She is a non-smoker.  Works as a Scientist, research (physical sciences), mainly sedentary.  Has been fatigued for multiple months now with not much exercise on a routine basis.   On presentation to the emergency room, afebrile, HR 70s-80s, BP 110s/80s, no supplemental O2 requirement.  CBC and BMP unremarkable.  High-sensitivity troponins negative x 2.  CXR unremarkable.  EKG unremarkable.  No longer having chest discomfort symptoms.  Hospital Course     Chest pain -- presented with recurrence of chest pain. Was initially planned for outpatient coronary CTa. hsTn negative x2. EKG without ischemia. No further episodes of chest pain while inpatient.  -- underwent CCTa with no evidence of CAD, calcium score of 0  Anxiety -- Continue home regimen including: Clonidine 0.1 mg nightly, trazodone nightly as needed, bupropion  General: Well developed, well nourished, female appearing in no acute distress. Head: Normocephalic, atraumatic.  Neck: Supple without bruits, JVD. Lungs:  Resp regular and unlabored, CTA. Heart: RRR, S1, S2, no S3, S4, or murmur; no rub. Abdomen: Soft, non-tender, non-distended with normoactive bowel sounds. No hepatomegaly.  No rebound/guarding. No obvious abdominal masses. Extremities: No clubbing, cyanosis, edema. Distal pedal pulses are 2+ bilaterally.  Neuro: Alert and oriented X 3. Moves all extremities spontaneously. Psych: Normal affect.  Patient was seen by Dr. Tresa Clay and deemed stable for discharge home. Follow up arranged in the office.   _____________  Discharge Vitals Blood  pressure 97/73, pulse 74, temperature 98 F (36.7 C), temperature source Oral, resp. rate 16, height 5\' 2"  (1.575 m), weight 82.6 kg, SpO2 93%.  Filed Weights   04/24/23 2324  Weight: 82.6 kg    Labs & Radiologic Studies    CBC Recent Labs    04/25/23 0020  WBC 8.4  NEUTROABS 5.1  HGB 12.2  HCT 35.3*  MCV 88.5  PLT 282   Basic Metabolic Panel Recent Labs    16/60/63 0020 04/26/23 1002  NA 138 137  K 3.3* 4.5  CL 108 104  CO2 20* 23  GLUCOSE 78 80  BUN 9 13  CREATININE 0.76 0.81  CALCIUM 8.9 9.4   Liver Function Tests No results for input(s): "AST", "ALT", "ALKPHOS", "BILITOT", "PROT", "ALBUMIN" in the last 72 hours. No results for input(s): "LIPASE", "AMYLASE" in the last 72 hours. High Sensitivity Troponin:   Recent Labs  Lab 04/25/23 0020 04/25/23 0157  TROPONINIHS <2 <2    BNP Invalid input(s): "POCBNP" D-Dimer No results for input(s): "DDIMER" in the last 72 hours. Hemoglobin A1C No results for input(s): "HGBA1C" in the last 72 hours. Fasting Lipid Panel No results for input(s): "CHOL", "HDL", "LDLCALC", "TRIG", "CHOLHDL", "LDLDIRECT" in the last 72 hours. Thyroid Function Tests No results for input(s): "TSH", "T4TOTAL", "T3FREE", "THYROIDAB" in the last 72 hours.  Invalid input(s): "FREET3" _____________  CT CORONARY MORPH W/CTA COR W/SCORE W/CA W/CM &/OR WO/CM  Addendum Date: 04/26/2023   ADDENDUM REPORT: 04/26/2023 09:49 EXAM: OVER-READ INTERPRETATION  CT CHEST The following report is an over-read performed by radiologist Dr. [facility]que [facility] on 04/26/2023. This over-read does not include interpretation of cardiac or coronary anatomy or pathology. The coronary CTA interpretation by the cardiologist is attached. COMPARISON:  None. FINDINGS: The visualized portions of the extracardiac vascular structures are unremarkable. Visualized mediastinum is unremarkable. Visualized portion of upper abdomen is unremarkable. Visualized  pulmonary parenchyma is unremarkable. Visualized skeleton is unremarkable. IMPRESSION: No definite abnormality involving the visualized portions of the extracardiac structures of the chest. Electronically Signed   By: Lupita Raider M.D.   On: 04/26/2023 09:49   Result Date: 04/26/2023 HISTORY: 47 yo female with chest pain, nonspecific EXAM: Cardiac/Coronary CTA TECHNIQUE: The patient was scanned on a [employer]. PROTOCOL: A 100 kV prospective scan was triggered in the descending thoracic aorta at 111 HU's. Axial non-contrast 3 mm slices were carried out through the heart. The data set was analyzed on a dedicated work station and scored using the Agatson method. Gantry rotation speed was 250 msecs and collimation was .6 mm. Beta blockade and 0.8 mg of sl NTG was given. The 3D data set was reconstructed in 5% intervals of the 35-75 % of the R-R cycle. Diastolic phases were analyzed on a dedicated work station using MPR, MIP and VRT modes. The patient received 95mL OMNIPAQUE IOHEXOL 350 MG/ML SOLN contrast. FINDINGS: Quality: Good, HR 39, mis-registration artifact Coronary calcium score: The patient's coronary artery calcium score is 0, which places the patient in the 0 percentile. Coronary arteries: Normal coronary origins.  Left dominance. Right Coronary Artery: Dominant. Normal vessel. Small R-PDA and  R-PLB branches without disease. Left Main Coronary Artery: Normal. Bifurcates into the LAD and LCx arteries. Left Anterior Descending Coronary Artery: Large anterior artery that does not reach the apex. Left Circumflex Artery: Left dominant. Large AV groove vessel, without disease. High OM1 branch without disease. Large OM2 and OM3 branches without disease, this does not feed the septum, rather the posterolateral wall. Aorta: Normal size, 27 mm at the mid ascending aorta (level of the PA bifurcation) measured double oblique. No calcifications. No dissection. Aortic Valve: Trileaflet. No calcifications.  Other findings: Normal pulmonary vein drainage into the left atrium. Normal left atrial appendage without a thrombus. Normal size of the pulmonary artery. Opacification of the distal left atrial appendage, suspect mixing artifact rather than thrombus. IMPRESSION: 1. No evidence of CAD, CADRADS = 0. 2. Coronary artery calcium score is 0, which places the patient in the 0 percentile for age/race and sex-matched control. 3. Normal coronary origin with normal dominance. 4. Opacification of the distal left atrial appendage, suspect mixing artifact rather than thrombus. 5. Consider non-coronary causes of chest pain. Electronically Signed: By: Chrystie Nose M.D. On: 04/26/2023 08:25   DG Chest Port 1 View  Result Date: 04/25/2023 CLINICAL DATA:  Chest pain EXAM: PORTABLE CHEST 1 VIEW COMPARISON:  10/16/2018 FINDINGS: The heart size and mediastinal contours are within normal limits. Both lungs are clear. The visualized skeletal structures are unremarkable. IMPRESSION: No active disease. Electronically Signed   By: Charlett Nose M.D.   On: 04/25/2023 02:10   Disposition   Pt is being discharged home today in good condition.  Follow-up Plans & Appointments     Follow-up Information     Sharlene Dory, PA-C Follow up on 05/26/2023.   Specialty: Cardiology Why: at 2:20pm for your follow up with cardiology Contact information: [address] [place] [address] [phone]                Discharge Instructions     Increase activity slowly   Complete by: As directed         Discharge Medications   Allergies as of 04/26/2023       Reactions   Sumatriptan Anaphylaxis, Swelling, Other (See Comments)   In throat. Numbness/tingling in body Other reaction(s): Respiratory Distress   Terbutaline    hypotension   Latex Itching, Rash        Medication List     STOP taking these medications    ivabradine 7.5 MG Tabs tablet Commonly known as: CORLANOR       TAKE  these medications    ALPRAZolam 0.5 MG tablet Commonly known as: XANAX Take 0.5 mg by mouth 2 (two) times daily as needed for anxiety.   amphetamine-dextroamphetamine 15 MG 24 hr capsule Commonly known as: ADDERALL XR Take 15 mg by mouth every morning.   Aplenzin 522 MG Tb24 Generic drug: BuPROPion HBr Take 522 mg by mouth daily.   aspirin EC 81 MG tablet Take 1 tablet (81 mg total) by mouth daily. Swallow whole.   cloNIDine 0.1 MG tablet Commonly known as: CATAPRES Take 0.1 mg by mouth at bedtime.   ibuprofen 200 MG tablet Commonly known as: ADVIL Take 400 mg by mouth daily as needed for mild pain.   LAMICTAL PO Take by mouth at bedtime.   levonorgestrel 20 MCG/24HR IUD Commonly known as: MIRENA 1 each by Intrauterine route once.   nitroGLYCERIN 0.4 MG SL tablet Commonly known as: NITROSTAT Place 1 tablet (0.4 mg total)  under the tongue every 5 (five) minutes as needed.   Ozempic (0.25 or 0.5 MG/DOSE) 2 MG/1.5ML Sopn Generic drug: Semaglutide(0.25 or 0.5MG /DOS) Inject 0.5 mg into the skin every Wednesday.   rosuvastatin 10 MG tablet Commonly known as: CRESTOR Take 1 tablet (10 mg total) by mouth daily.   traZODone 50 MG tablet Commonly known as: DESYREL Take 50-100 mg by mouth at bedtime as needed for sleep.   Vyvanse 30 MG capsule Generic drug: lisdexamfetamine Take 30 mg by mouth daily.          Outstanding Labs/Studies   Outpatient echo  Duration of Discharge Encounter   Greater than 30 minutes including physician time.  Signed, Laverda Page, NP 04/26/2023, 12:46 PM   Patient seen and examined. Agree with assessment and plan.  Patient feels well.  Coronary CTA done this morning shows a calcium score of 0.  Chest CT over read did not show any significant abnormalities.  Doubt ischemic etiology to the patient's chest discomfort.  Consider the possibility of esophageal spasm since that time she does note occasional dyspepsia.  If unable to  obtain echo this morning can DC and plan for outpatient 2D echo Doppler evaluation.  Patient does have periods of significant fatigue.  She believes she is sleeping well but at times her husband states there is some mild snoring.  She is on Ozempic for weight loss.  DC today.   Lennette Bihari, MD, [facility] 04/26/2023 12:46 PM

## 2023-05-01 ENCOUNTER — Ambulatory Visit (HOSPITAL_COMMUNITY): Payer: 59

## 2023-05-02 ENCOUNTER — Encounter: Payer: Self-pay | Admitting: Internal Medicine

## 2023-05-02 ENCOUNTER — Ambulatory Visit: Payer: 59 | Admitting: Internal Medicine

## 2023-05-02 VITALS — BP 118/68 | HR 79 | Temp 98.2°F | Ht 62.0 in | Wt 181.0 lb

## 2023-05-02 DIAGNOSIS — R0789 Other chest pain: Secondary | ICD-10-CM | POA: Diagnosis not present

## 2023-05-02 DIAGNOSIS — R197 Diarrhea, unspecified: Secondary | ICD-10-CM

## 2023-05-02 DIAGNOSIS — R112 Nausea with vomiting, unspecified: Secondary | ICD-10-CM

## 2023-05-02 DIAGNOSIS — F419 Anxiety disorder, unspecified: Secondary | ICD-10-CM

## 2023-05-02 DIAGNOSIS — N898 Other specified noninflammatory disorders of vagina: Secondary | ICD-10-CM | POA: Insufficient documentation

## 2023-05-02 HISTORY — DX: Nausea with vomiting, unspecified: R11.2

## 2023-05-02 LAB — CBC WITH DIFFERENTIAL/PLATELET
Basophils Absolute: 0 10*3/uL (ref 0.0–0.1)
Basophils Relative: 0.2 % (ref 0.0–3.0)
Eosinophils Absolute: 0.2 10*3/uL (ref 0.0–0.7)
Eosinophils Relative: 2.1 % (ref 0.0–5.0)
HCT: 40.9 % (ref 36.0–46.0)
Hemoglobin: 13.8 g/dL (ref 12.0–15.0)
Lymphocytes Relative: 21.7 % (ref 12.0–46.0)
Lymphs Abs: 1.9 10*3/uL (ref 0.7–4.0)
MCHC: 33.8 g/dL (ref 30.0–36.0)
MCV: 90.2 fL (ref 78.0–100.0)
Monocytes Absolute: 0.5 10*3/uL (ref 0.1–1.0)
Monocytes Relative: 5.8 % (ref 3.0–12.0)
Neutro Abs: 6.3 10*3/uL (ref 1.4–7.7)
Neutrophils Relative %: 70.2 % (ref 43.0–77.0)
Platelets: 321 10*3/uL (ref 150.0–400.0)
RBC: 4.54 Mil/uL (ref 3.87–5.11)
RDW: 12.1 % (ref 11.5–15.5)
WBC: 8.9 10*3/uL (ref 4.0–10.5)

## 2023-05-02 LAB — HEPATIC FUNCTION PANEL
ALT: 7 U/L (ref 0–35)
AST: 18 U/L (ref 0–37)
Albumin: 4.3 g/dL (ref 3.5–5.2)
Alkaline Phosphatase: 41 U/L (ref 39–117)
Bilirubin, Direct: 0.1 mg/dL (ref 0.0–0.3)
Total Bilirubin: 0.5 mg/dL (ref 0.2–1.2)
Total Protein: 7.1 g/dL (ref 6.0–8.3)

## 2023-05-02 LAB — BASIC METABOLIC PANEL
BUN: 11 mg/dL (ref 6–23)
CO2: 25 meq/L (ref 19–32)
Calcium: 8.8 mg/dL (ref 8.4–10.5)
Chloride: 107 meq/L (ref 96–112)
Creatinine, Ser: 0.79 mg/dL (ref 0.40–1.20)
GFR: 89.87 mL/min (ref >60.00)
Glucose, Bld: 80 mg/dL (ref 70–99)
Potassium: 4 meq/L (ref 3.5–5.1)
Sodium: 138 meq/L (ref 135–145)

## 2023-05-02 LAB — LIPASE: Lipase: 34 U/L (ref 11.0–59.0)

## 2023-05-02 MED ORDER — ONDANSETRON HCL 4 MG/2ML IJ SOLN
4.0000 mg | Freq: Once | INTRAMUSCULAR | Status: AC
Start: 2023-05-02 — End: 2023-05-02
  Administered 2023-05-02: 4 mg via INTRAMUSCULAR

## 2023-05-02 MED ORDER — DIPHENOXYLATE-ATROPINE 2.5-0.025 MG PO TABS: 1.0000 | ORAL_TABLET | Freq: Four times a day (QID) | ORAL | 1 refills | Status: DC | PRN

## 2023-05-02 MED ORDER — ONDANSETRON 4 MG PO TBDP: 4.0000 mg | ORAL_TABLET | Freq: Three times a day (TID) | ORAL | 1 refills | Status: DC | PRN

## 2023-05-02 NOTE — Assessment & Plan Note (Signed)
With ruq /right lower chest pain - consider abd u/s for any abnormal LFTs

## 2023-05-02 NOTE — Patient Instructions (Signed)
You are given the nausea shot today  Please take all new medication as prescribed - the zofran and lomotil for nausea and diarrhea  Please continue to push oral fluids such as gatorade and pedialyte or water  Please continue all other medications as before, and refills have been done if requested.  Please have the pharmacy call with any other refills you may need.  Please keep your appointments with your specialists as you may have planned  Please go to the LAB at the blood drawing area for the tests to be done  You will be contacted by phone if any changes need to be made immediately.  Otherwise, you will receive a letter about your results with an explanation, but please check with MyChart first.  You are given the work note

## 2023-05-02 NOTE — Progress Notes (Unsigned)
Patient ID: Kelly Clay, female   DOB: [birth year], 47 y.o.   MRN: [id number]        Chief Complaint: follow up with 3 days onset n/v/d, feverish       HPI:  Kelly Clay is a 47 y.o. female here with above, with most abd pain at the ruq was recently seen for chest pain now with CV eval ongoing.  Denies worsening reflux, dysphagia, or blood.  No recent antibx Pt denies increased sob or doe, wheezing, orthopnea, PND, increased LE swelling, palpitations, dizziness or syncope.   Pt denies polydipsia, polyuria, or new focal neuro s/s.  Denies worsening depressive symptoms, suicidal ideation, or panic; has ongoing anxiety       Wt Readings from Last 3 Encounters:  05/02/23 181 lb (82.1 kg)  04/24/23 182 lb (82.6 kg)  04/19/23 186 lb (84.4 kg)   BP Readings from Last 3 Encounters:  05/02/23 118/68  04/26/23 97/73  04/19/23 110/70         Past Medical History:  Diagnosis Date   Abnormal Pap smear    colpo   AMA (advanced maternal age) multigravida 35+    Anxiety    Complication of anesthesia    epidurals did not take   Depression    H/O hiatal hernia    Headache(784.0)    Nausea vomiting and diarrhea 05/02/2023   Preterm labor    induced at 36wks, PIH   Urinary tract infection    Past Surgical History:  Procedure Laterality Date   CESAREAN SECTION  07/26/2012   Procedure: CESAREAN SECTION;  Surgeon: Lavina Hamman, MD;  Location: [facility];  Service: Obstetrics;  Laterality: N/A;  Primary Cesarean Section Delivery Baby Girl @ [phone], Apgars 9/9   DILATION AND CURETTAGE OF UTERUS     TONSILLECTOMY      reports that she has never smoked. She has never used smokeless tobacco. She reports current alcohol use. She reports that she does not use drugs. family history includes Birth defects in her son; Breast cancer in her maternal grandmother; COPD in her mother; Cancer in her maternal grandmother; Diabetes in her father; Heart disease in her mother; Hypertension in her father and  mother; Thyroid disease in her mother. Allergies  Allergen Reactions   Sumatriptan Anaphylaxis, Swelling and Other (See Comments)    In throat. Numbness/tingling in body Other reaction(s): Respiratory Distress   Terbutaline     hypotension   Latex Itching and Rash   Current Outpatient Medications on File Prior to Visit  Medication Sig Dispense Refill   ALPRAZolam (XANAX) 0.5 MG tablet Take 0.5 mg by mouth 2 (two) times daily as needed for anxiety.     amphetamine-dextroamphetamine (ADDERALL XR) 15 MG 24 hr capsule Take 15 mg by mouth every morning.     APLENZIN 522 MG TB24 Take 522 mg by mouth daily.     aspirin EC 81 MG tablet Take 1 tablet (81 mg total) by mouth daily. Swallow whole. 90 tablet 3   cloNIDine (CATAPRES) 0.1 MG tablet Take 0.1 mg by mouth at bedtime.     ibuprofen (ADVIL,MOTRIN) 200 MG tablet Take 400 mg by mouth daily as needed for mild pain.     lamoTRIgine (LAMICTAL PO) Take by mouth at bedtime.     levonorgestrel (MIRENA) 20 MCG/24HR IUD 1 each by Intrauterine route once.     nitroGLYCERIN (NITROSTAT) 0.4 MG SL tablet Place 1 tablet (0.4 mg total) under the tongue every 5 (five) minutes as  needed. 25 tablet 3   rosuvastatin (CRESTOR) 10 MG tablet Take 1 tablet (10 mg total) by mouth daily. 30 tablet 0   Semaglutide,0.25 or 0.5MG /DOS, (OZEMPIC, 0.25 OR 0.5 MG/DOSE,) 2 MG/1.5ML SOPN Inject 0.5 mg into the skin every Wednesday.     traZODone (DESYREL) 50 MG tablet Take 50-100 mg by mouth at bedtime as needed for sleep.     Vitamin D, Ergocalciferol, 50000 units CAPS Take 1 capsule by mouth once a week.     VYVANSE 30 MG capsule Take 30 mg by mouth daily.     No current facility-administered medications on file prior to visit.        ROS:  All others reviewed and negative.  Objective        PE:  BP 118/68 (BP Location: Left Arm, Patient Position: Sitting, Cuff Size: Normal)   Pulse 79   Temp 98.2 F (36.8 C) (Oral)   Ht 5\' 2"  (1.575 m)   Wt 181 lb (82.1 kg)    SpO2 96%   BMI 33.11 kg/m                 Constitutional: Pt appears in NAD               HENT: Head: NCAT.                Right Ear: External ear normal.                 Left Ear: External ear normal.                Eyes: . Pupils are equal, round, and reactive to light. Conjunctivae and EOM are normal               Nose: without d/c or deformity               Neck: Neck supple. Gross normal ROM               Cardiovascular: Normal rate and regular rhythm.                 Pulmonary/Chest: Effort normal and breath sounds without rales or wheezing.                Abd:  Soft, diffuse mild abd tender, ND, + BS, no organomegaly               Neurological: Pt is alert. At baseline orientation, motor grossly intact               Skin: Skin is warm. No rashes, no other new lesions, LE edema - none               Psychiatric: Pt behavior is normal without agitation , mod nervous  Micro: none  Cardiac tracings I have personally interpreted today:  none  Pertinent Radiological findings (summarize): none   Lab Results  Component Value Date   WBC 8.9 05/02/2023   HGB 13.8 05/02/2023   HCT 40.9 05/02/2023   PLT 321.0 05/02/2023   GLUCOSE 80 05/02/2023   CHOL 168 10/03/2022   TRIG 81.0 10/03/2022   HDL 38.20 (L) 10/03/2022   LDLCALC 114 (H) 10/03/2022   ALT 7 05/02/2023   AST 18 05/02/2023   NA 138 05/02/2023   K 4.0 05/02/2023   CL 107 05/02/2023   CREATININE 0.79 05/02/2023   BUN 11 05/02/2023   CO2 25 05/02/2023   TSH 2.61 04/18/2023  Assessment/Plan:  Kelly Clay is a 47 y.o. White or Caucasian [1] female with  has a past medical history of Abnormal Pap smear, AMA (advanced maternal age) multigravida 35+, Anxiety, Complication of anesthesia, Depression, H/O hiatal hernia, Headache(784.0), Nausea vomiting and diarrhea (05/02/2023), Preterm labor, and Urinary tract infection.  Other chest pain With ruq /right lower chest pain - consider abd u/s for any abnormal  LFTs  Nausea vomiting and diarrhea With feverish, most likely consistent with viral AGE - for zofran 4 mg IM, then zofran po prn, lomotil  prn, hold on imaging for now, but labs including cbc LFTs, push fluids, tylenol prn, and work note for this week  Anxiety Chronic stable, for xanax and lamictal continue  Followup: Return if symptoms worsen or fail to improve.  Oliver Barre, MD 05/06/2023 12:05 PM [facility]

## 2023-05-02 NOTE — Assessment & Plan Note (Signed)
With feverish, most likely consistent with viral AGE - for zofran 4 mg IM, then zofran po prn, lomotil  prn, hold on imaging for now, but labs including cbc LFTs, push fluids, tylenol prn, and work note for this week

## 2023-05-06 ENCOUNTER — Encounter: Payer: Self-pay | Admitting: Internal Medicine

## 2023-05-06 NOTE — Assessment & Plan Note (Signed)
Chronic stable, for xanax and lamictal continue

## 2023-05-26 ENCOUNTER — Ambulatory Visit (INDEPENDENT_AMBULATORY_CARE_PROVIDER_SITE_OTHER): Payer: 59

## 2023-05-26 ENCOUNTER — Ambulatory Visit: Payer: 59 | Attending: Physician Assistant | Admitting: Physician Assistant

## 2023-05-26 ENCOUNTER — Encounter: Payer: Self-pay | Admitting: Physician Assistant

## 2023-05-26 VITALS — BP 130/82 | HR 90 | Ht 62.0 in | Wt 184.2 lb

## 2023-05-26 DIAGNOSIS — Z8249 Family history of ischemic heart disease and other diseases of the circulatory system: Secondary | ICD-10-CM

## 2023-05-26 DIAGNOSIS — R0602 Shortness of breath: Secondary | ICD-10-CM | POA: Diagnosis not present

## 2023-05-26 DIAGNOSIS — R5383 Other fatigue: Secondary | ICD-10-CM | POA: Diagnosis not present

## 2023-05-26 DIAGNOSIS — E785 Hyperlipidemia, unspecified: Secondary | ICD-10-CM

## 2023-05-26 DIAGNOSIS — R072 Precordial pain: Secondary | ICD-10-CM

## 2023-05-26 MED ORDER — ROSUVASTATIN CALCIUM 10 MG PO TABS: 10.0000 mg | ORAL_TABLET | Freq: Every day | ORAL | 3 refills | Status: DC

## 2023-05-26 NOTE — Progress Notes (Signed)
Culture now has Cardiology Office Note:  .   Date:  05/26/2023  ID:  Kelly Clay, DOB August 10, 1975, MRN [id number] PCP: Myrlene Broker, MD  [facility] Providers Cardiologist:  Elder Negus, MD {  History of Present Illness: .   Kelly Clay is a 47 y.o. female with a past medical history of early CAD in her family, and previous chest pain here for follow-up appointment.  She was seen by Dr. Rosemary Holms 04/19/2023 and 4 days prior to the appointment she experienced some chest pain with radiation to her left arm.  Patient was driving and did not have any particular mental stress factors.  Pain was off-and-on, squeezing-like and radiating to left arm with numbness in her left arm as well as radiating to her jaw.  She feels like she was tired and fatigued at that time.  Entire episode lasted less than 30 minutes.  Patient did not seek any emergency medical care at that time.  Another such episode occurred the morning of her appointment after stressful moment at work and lasted for about 5 minutes.  No associated shortness of breath, syncope, or near syncope.  Patient is a non-smoker and has extensive family history of CAD with multiple members with MIs in their 53s and 68s including her mother.  She works at [employer] and works mostly in 1 spot, sedentary.  Does not get much exercise outside of work.  Has been exhausted lately with day-to-day activities.  Eats steak roughly twice a week.  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 She reports a family history of coronary disease but is a nonsmoker. She has had a coronary CTA scan done in the hospital. Calcium score was 0.  The patient also reports episodes of severe pain and sickness, suspecting it could be related to her gallbladder. She has been experiencing increased heart palpitations and fatigue. The patient is currently on several  medications including Prilosec, Trazodone, and Ozempic. She has been experiencing these symptoms for a few weeks and has noticed an increase in heart palpitations.  Reports no shortness of breath nor dyspnea on exertion. Reports no chest pain, pressure, or tightness. No edema, orthopnea, PND.    Discussed the use of AI scribe software for clinical note transcription with the patient, who gave verbal consent to proceed.  ROS: Pertinent ROS in HPI  Studies Reviewed: Marland Kitchen       Coronary CTA 04/25/2023 Coronary calcium score: The patient's coronary artery calcium score is 0, which places the patient in the 0 percentile.   Coronary arteries: Normal coronary origins.  Left dominance.   Right Coronary Artery: Dominant. Normal vessel. Small R-PDA and R-PLB branches without disease.   Left Main Coronary Artery: Normal. Bifurcates into the LAD and LCx arteries.   Left Anterior Descending Coronary Artery: Large anterior artery that does not reach the apex.   Left Circumflex Artery: Left dominant. Large AV groove vessel, without disease. High OM1 branch without disease. Large OM2 and OM3 branches without disease, this does not feed the septum, rather the posterolateral wall.   Aorta: Normal size, 27 mm at the mid ascending aorta (level of the PA bifurcation) measured double oblique. No calcifications. No dissection.   Aortic Valve: Trileaflet. No calcifications.   Other findings:   Normal pulmonary vein drainage into the left atrium.   Normal left atrial appendage without a thrombus.   Normal size of the  pulmonary artery.   Opacification of the distal left atrial appendage, suspect mixing artifact rather than thrombus.        Physical Exam:   VS:  BP 130/82   Pulse 90   Ht 5\' 2"  (1.575 m)   Wt 184 lb 3.2 oz (83.6 kg)   SpO2 98%   BMI 33.69 kg/m    Wt Readings from Last 3 Encounters:  05/26/23 184 lb 3.2 oz (83.6 kg)  05/02/23 181 lb (82.1 kg)  04/24/23 182 lb (82.6 kg)     GEN: Well nourished, well developed in no acute distress NECK: No JVD; No carotid bruits CARDIAC: RRR, no murmurs, rubs, gallops RESPIRATORY:  Clear to auscultation without rales, wheezing or rhonchi  ABDOMEN: Soft, non-tender, non-distended EXTREMITIES:  No edema; No deformity   ASSESSMENT AND PLAN: .    Fatigue and Shortness of Breath New onset of fatigue and shortness of breath in the setting of a family history of coronary disease. Recent coronary CTA showed no disease. Patient reports palpitations which have increased recently. -Schedule outpatient 2D echo to assess heart pump function, valvular function, and structure of the heart. -Zio monitor to evaluate for SVT and to rule out dangerous arrhythmias  Possible Gallbladder Disease Patient reports episodes of severe sickness after eating certain foods, suggestive of gallbladder disease. Recent labs were normal. -Consider rechecking liver function tests and possibly scheduling an ultrasound to evaluate gallbladder.  Hyperlipidemia LDL slightly elevated at 114 on recent labs. Patient is currently on Crestor. -Continue Crestor daily. -Recheck lipid panel in spring.     Dispo: She can follow-up in 6 weeks with me to review test results  Signed, Sharlene Dory, PA-C

## 2023-05-26 NOTE — Patient Instructions (Signed)
Medication Instructions:  No changes *If you need a refill on your cardiac medications before your next appointment, please call your pharmacy*   Lab Work: none If you have labs (blood work) drawn today and your tests are completely normal, you will receive your results only by: MyChart Message (if you have MyChart) OR A paper copy in the mail If you have any lab test that is abnormal or we need to change your treatment, we will call you to review the results.   Testing/Procedures: Your physician has requested that you have an echocardiogram. Echocardiography is a painless test that uses sound waves to create images of your heart. It provides your doctor with information about the size and shape of your heart and how well your heart's chambers and valves are working. This procedure takes approximately one hour. There are no restrictions for this procedure. Please do NOT wear cologne, perfume, aftershave, or lotions (deodorant is allowed). Please arrive 15 minutes prior to your appointment time.  Please note: We ask at that you not bring children with you during ultrasound (echo/ vascular) testing. Due to room size and safety concerns, children are not allowed in the ultrasound rooms during exams. Our front office staff cannot provide observation of children in our lobby area while testing is being conducted. An adult accompanying a patient to their appointment will only be allowed in the ultrasound room at the discretion of the ultrasound technician under special circumstances. We apologize for any inconvenience.  Zio XT heart monitor - 14 days   Follow-Up: At [facility], you and your health needs are our priority.  As part of our continuing mission to provide you with exceptional heart care, we have created designated Provider Care Teams.  These Care Teams include your primary Cardiologist (physician) and Advanced Practice Providers (APPs -  Physician Assistants and Nurse  Practitioners) who all work together to provide you with the care you need, when you need it.   Your next appointment:   6 month(s)  Provider:   Elder Negus, MD  or Jari Favre, PA-C

## 2023-05-26 NOTE — Progress Notes (Unsigned)
Applied a 14 day Zio XT monitor to patient in the office  Patwardhan to read

## 2023-06-30 ENCOUNTER — Ambulatory Visit
Admission: RE | Admit: 2023-06-30 | Discharge: 2023-06-30 | Disposition: A | Discharge status: Home / Self Care | Payer: Commercial Managed Care - HMO | Source: Ambulatory Visit | Attending: Plastic Surgery | Admitting: Plastic Surgery

## 2023-06-30 ENCOUNTER — Ambulatory Visit
Admission: RE | Admit: 2023-06-30 | Discharge: 2023-06-30 | Disposition: A | Discharge status: Home / Self Care | Payer: 59 | Source: Ambulatory Visit | Attending: Plastic Surgery | Admitting: Plastic Surgery

## 2023-06-30 ENCOUNTER — Other Ambulatory Visit: Payer: Self-pay | Admitting: Plastic Surgery

## 2023-06-30 DIAGNOSIS — N644 Mastodynia: Secondary | ICD-10-CM

## 2023-07-10 ENCOUNTER — Ambulatory Visit (HOSPITAL_COMMUNITY): Payer: Commercial Managed Care - HMO | Attending: Physician Assistant

## 2023-07-10 DIAGNOSIS — R0609 Other forms of dyspnea: Secondary | ICD-10-CM

## 2023-07-10 DIAGNOSIS — R072 Precordial pain: Secondary | ICD-10-CM | POA: Diagnosis present

## 2023-07-10 LAB — ECHOCARDIOGRAM COMPLETE
Area-P 1/2: 3.88 cm2
S' Lateral: 3.4 cm

## 2023-07-11 ENCOUNTER — Telehealth: Payer: Self-pay | Admitting: Physician Assistant

## 2023-07-11 NOTE — Telephone Encounter (Signed)
Pt aware see echo report note./cy

## 2023-07-11 NOTE — Telephone Encounter (Signed)
Pt returning call for echo results  

## 2023-11-16 DIAGNOSIS — Z30433 Encounter for removal and reinsertion of intrauterine contraceptive device: Secondary | ICD-10-CM | POA: Diagnosis not present

## 2024-01-16 DIAGNOSIS — Z3202 Encounter for pregnancy test, result negative: Secondary | ICD-10-CM | POA: Diagnosis not present

## 2024-01-16 DIAGNOSIS — Z30431 Encounter for routine checking of intrauterine contraceptive device: Secondary | ICD-10-CM | POA: Diagnosis not present

## 2024-01-16 DIAGNOSIS — N898 Other specified noninflammatory disorders of vagina: Secondary | ICD-10-CM | POA: Diagnosis not present

## 2024-01-22 DIAGNOSIS — Z30431 Encounter for routine checking of intrauterine contraceptive device: Secondary | ICD-10-CM | POA: Diagnosis not present

## 2024-01-23 ENCOUNTER — Ambulatory Visit: Payer: Self-pay

## 2024-01-23 ENCOUNTER — Encounter: Payer: Self-pay | Admitting: Internal Medicine

## 2024-01-23 ENCOUNTER — Ambulatory Visit (INDEPENDENT_AMBULATORY_CARE_PROVIDER_SITE_OTHER): Admitting: Internal Medicine

## 2024-01-23 VITALS — BP 112/74 | HR 86 | Temp 98.0°F | Ht 62.0 in | Wt 158.4 lb

## 2024-01-23 DIAGNOSIS — R197 Diarrhea, unspecified: Secondary | ICD-10-CM

## 2024-01-23 DIAGNOSIS — R112 Nausea with vomiting, unspecified: Secondary | ICD-10-CM | POA: Diagnosis not present

## 2024-01-23 DIAGNOSIS — R1013 Epigastric pain: Secondary | ICD-10-CM | POA: Insufficient documentation

## 2024-01-23 LAB — CBC WITH DIFFERENTIAL/PLATELET
Basophils Absolute: 0 K/uL (ref 0.0–0.1)
Basophils Relative: 0.1 % (ref 0.0–3.0)
Eosinophils Absolute: 0.3 K/uL (ref 0.0–0.7)
Eosinophils Relative: 3.8 % (ref 0.0–5.0)
HCT: 40 % (ref 36.0–46.0)
Hemoglobin: 13.6 g/dL (ref 12.0–15.0)
Lymphocytes Relative: 32.4 % (ref 12.0–46.0)
Lymphs Abs: 2.3 K/uL (ref 0.7–4.0)
MCHC: 34 g/dL (ref 30.0–36.0)
MCV: 90.1 fl (ref 78.0–100.0)
Monocytes Absolute: 0.4 K/uL (ref 0.1–1.0)
Monocytes Relative: 5.3 % (ref 3.0–12.0)
Neutro Abs: 4.1 K/uL (ref 1.4–7.7)
Neutrophils Relative %: 58.4 % (ref 43.0–77.0)
Platelets: 305 K/uL (ref 150.0–400.0)
RBC: 4.44 Mil/uL (ref 3.87–5.11)
RDW: 12 % (ref 11.5–15.5)
WBC: 7 K/uL (ref 4.0–10.5)

## 2024-01-23 LAB — LIPASE: Lipase: 33 U/L (ref 11.0–59.0)

## 2024-01-23 LAB — HEPATIC FUNCTION PANEL
ALT: 4 U/L (ref 0–35)
AST: 13 U/L (ref 0–37)
Albumin: 4 g/dL (ref 3.5–5.2)
Alkaline Phosphatase: 36 U/L — ABNORMAL LOW (ref 39–117)
Bilirubin, Direct: 0.1 mg/dL (ref 0.0–0.3)
Total Bilirubin: 0.5 mg/dL (ref 0.2–1.2)
Total Protein: 6.2 g/dL (ref 6.0–8.3)

## 2024-01-23 LAB — SEDIMENTATION RATE: Sed Rate: 6 mm/h (ref 0–20)

## 2024-01-23 LAB — BASIC METABOLIC PANEL WITH GFR
BUN: 9 mg/dL (ref 6–23)
CO2: 29 meq/L (ref 19–32)
Calcium: 8.4 mg/dL (ref 8.4–10.5)
Chloride: 106 meq/L (ref 96–112)
Creatinine, Ser: 0.74 mg/dL (ref 0.40–1.20)
GFR: 96.71 mL/min (ref >60.00)
Glucose, Bld: 97 mg/dL (ref 70–99)
Potassium: 4 meq/L (ref 3.5–5.1)
Sodium: 137 meq/L (ref 135–145)

## 2024-01-23 MED ORDER — DIPHENOXYLATE-ATROPINE 2.5-0.025 MG PO TABS: 2.0000 | ORAL_TABLET | Freq: Four times a day (QID) | ORAL | 0 refills | Status: AC | PRN

## 2024-01-23 MED ORDER — ONDANSETRON 4 MG PO TBDP: 4.0000 mg | ORAL_TABLET | Freq: Three times a day (TID) | ORAL | 2 refills | Status: AC | PRN

## 2024-01-23 NOTE — Telephone Encounter (Signed)
 FYI Only or Action Required?: FYI only for provider.  Patient was last seen in primary care on 05/02/2023 by Norleen Lynwood ORN, MD.  Called Nurse Triage reporting Abdominal Pain.  Symptoms began about a month ago.  Interventions attempted: Nothing.  Symptoms are: gradually worsening.  Triage Disposition: See HCP Within 4 Hours (Or PCP Triage)  Patient/caregiver understands and will follow disposition?: Yes    Copied from CRM [id number]. Topic: Clinical - Red Word Triage >> Jan 23, 2024 11:05 AM Deleta RAMAN wrote: Red Word that prompted transfer to Nurse Triage: patient has weight loss, extreme diarrhea, and stomach is constantly hurting. Patient has been on a diet this has been going on for about 1-2 months. Reason for Disposition  [1] MILD-MODERATE pain AND [2] constant AND [3] present > 2 hours  Answer Assessment - Initial Assessment Questions Patient denies any antibiotics usage for last 2 months. Patient states she drinks a lot of water to stay hydrated.   1. LOCATION: Where does it hurt?      Upper stomach across the top  2. RADIATION: Does the pain shoot anywhere else? (e.g., chest, back)     Sometimes radiates to RUQ  3. ONSET: When did the pain begin? (e.g., minutes, hours or days ago)      1-2 months but gradually worsening  4. SUDDEN: Gradual or sudden onset?     States it'll come on gradually  5. PATTERN Does the pain come and go, or is it constant?     Comes and goes, more frequent, sometimes a few times a week  6. SEVERITY: How bad is the pain?  (e.g., Scale 1-10; mild, moderate, or severe)     8-9  7. RECURRENT SYMPTOM: Have you ever had this type of stomach pain before? If Yes, ask: When was the last time? and What happened that time?      Yes   8. AGGRAVATING FACTORS: Does anything seem to cause this pain? (e.g., foods, stress, alcohol)     Patient states it starts with sulfur belches  9. CARDIAC SYMPTOMS: Do you have any of the  following symptoms: chest pain, difficulty breathing, sweating, nausea?     No  10. OTHER SYMPTOMS: Do you have any other symptoms? (e.g., back pain, diarrhea, fever, urination pain, vomiting)       Nausea, diarrhea, vomiting intermittently, weakness  Protocols used: Abdominal Pain - Upper-A-AH

## 2024-01-23 NOTE — Patient Instructions (Addendum)
 Please take all new medication as prescribed - the zofran  and lomotil  as needed  Please continue all other medications as before, and refills have been done if requested.  Please have the pharmacy call with any other refills you may need.  Please keep your appointments with your specialists as you may have planned  You will be contacted regarding the referral for: CT abd pelvis (urgent), and Gastroenterology  Please go to the LAB at the blood drawing area for the tests to be done  You will be contacted by phone if any changes need to be made immediately.  Otherwise, you will receive a letter about your results with an explanation, but please check with MyChart first.

## 2024-01-23 NOTE — Progress Notes (Signed)
 Patient ID: Kelly Clay, female   DOB: [birth year], 48 y.o.   MRN: [id number]        Chief Complaint: follow up abd pain, n/v and diarrhea       HPI:  Kelly Clay is a 48 y.o. female here with 2 months onset persistent symptoms at least twice per wk with persistent n/v x 2 hours and diarrhea sometimes not controlled over 6 hrs, both occurring about twice per wk, in addition to mild to mod persistent epigastric pain with maybe some pain to the RUQ.   Denies fever, chills, increased stress. Did see GYN recently with IUD check, o/w ok exam.  May have had a small spotting of blood on the tissue for the first time yesterday.  Stopped her wegovy a few wks ago but no improvement in symptoms.  Had somewhat similar much milder symptoms late 2024 with normal LFTs and cbc.  Denies urinary symptoms such as dysuria, frequency, urgency, flank pain, hematuria or n/v, fever, chills.  Denies worsening reflux, dysphagia       Wt Readings from Last 3 Encounters:  01/23/24 158 lb 6.4 oz (71.8 kg)  05/26/23 184 lb 3.2 oz (83.6 kg)  05/02/23 181 lb (82.1 kg)   BP Readings from Last 3 Encounters:  01/23/24 112/74  05/26/23 130/82  05/02/23 118/68         Past Medical History:  Diagnosis Date   Abnormal Pap smear    colpo   AMA (advanced maternal age) multigravida 35+    Anxiety    Complication of anesthesia    epidurals did not take   Depression    H/O hiatal hernia    Headache(784.0)    Nausea vomiting and diarrhea 05/02/2023   Preterm labor    induced at 36wks, PI[facility]   Urinary tract infection    Past Surgical History:  Procedure Laterality Date   AUGMENTATION MAMMAPLASTY Bilateral    2023   CESAREAN SECTION  07/26/2012   Procedure: CESAREAN SECTION;  Surgeon: Krystal Deaner, MD;  Location: [facility];  Service: Obstetrics;  Laterality: N/A;  Primary Cesarean Section Delivery Baby Girl @ [phone], Apgars 9/9   DILATION AND CURETTAGE OF UTERUS     TONSILLECTOMY      reports that she has never  smoked. She has never used smokeless tobacco. She reports current alcohol use. She reports that she does not use drugs. family history includes Birth defects in her son; Breast cancer in her maternal grandmother; COPD in her mother; Cancer in her maternal grandmother; Diabetes in her father; Heart disease in her mother; Hypertension in her father and mother; Thyroid  disease in her mother. Allergies  Allergen Reactions   Sumatriptan Anaphylaxis, Swelling and Other (See Comments)    In throat. Numbness/tingling in body Other reaction(s): Respiratory Distress   Terbutaline      hypotension   Latex Itching and Rash   Current Outpatient Medications on File Prior to Visit  Medication Sig Dispense Refill   ALPRAZolam (XANAX) 0.5 MG tablet Take 0.5 mg by mouth 2 (two) times daily as needed for anxiety.     amphetamine-dextroamphetamine (ADDERALL XR) 15 MG 24 hr capsule Take 15 mg by mouth every morning.     APLENZIN  522 MG TB24 Take 522 mg by mouth daily.     aspirin  EC 81 MG tablet Take 1 tablet (81 mg total) by mouth daily. Swallow whole. 90 tablet 3   cloNIDine  (CATAPRES ) 0.1 MG tablet Take 0.1 mg by mouth at bedtime.  ibuprofen  (ADVIL ,MOTRIN ) 200 MG tablet Take 400 mg by mouth daily as needed for mild pain.     lamoTRIgine (LAMICTAL PO) Take by mouth at bedtime.     levonorgestrel  (MIRENA ) 20 MCG/24HR IUD 1 each by Intrauterine route once.     nitroGLYCERIN  (NITROSTAT ) 0.4 MG SL tablet Place 1 tablet (0.4 mg total) under the tongue every 5 (five) minutes as needed. 25 tablet 3   rosuvastatin  (CRESTOR ) 10 MG tablet Take 1 tablet (10 mg total) by mouth daily. 90 tablet 3   Semaglutide,0.25 or 0.5MG /DOS, (OZEMPIC, 0.25 OR 0.5 MG/DOSE,) 2 MG/1.5ML SOPN Inject 0.5 mg into the skin every Wednesday.     traZODone  (DESYREL ) 50 MG tablet Take 50-100 mg by mouth at bedtime as needed for sleep.     Vitamin D , Ergocalciferol , 50000 units CAPS Take 1 capsule by mouth once a week.     VYVANSE 30 MG capsule  Take 30 mg by mouth daily.     No current facility-administered medications on file prior to visit.        ROS:  All others reviewed and negative.  Objective        PE:  BP 112/74   Pulse 86   Temp 98 F (36.7 C)   Ht 5' 2 (1.575 m)   Wt 158 lb 6.4 oz (71.8 kg)   SpO2 98%   BMI 28.97 kg/m                 Constitutional: Pt appears in NAD               HENT: Head: NCAT.                Right Ear: External ear normal.                 Left Ear: External ear normal.                Eyes: . Pupils are equal, round, and reactive to light. Conjunctivae and EOM are normal               Nose: without d/c or deformity               Neck: Neck supple. Gross normal ROM               Cardiovascular: Normal rate and regular rhythm.                 Pulmonary/Chest: Effort normal and breath sounds without rales or wheezing.                Abd:  Soft, NT, ND, + BS, no organomegaly               Neurological: Pt is alert. At baseline orientation, motor grossly intact               Skin: Skin is warm. No rashes, no other new lesions, LE edema - none               Psychiatric: Pt behavior is normal without agitation   Micro: none  Cardiac tracings I have personally interpreted today:  none  Pertinent Radiological findings (summarize): none   Lab Results  Component Value Date   WBC 8.9 05/02/2023   HGB 13.8 05/02/2023   HCT 40.9 05/02/2023   PLT 321.0 05/02/2023   GLUCOSE 80 05/02/2023   CHOL 168 10/03/2022   TRIG 81.0 10/03/2022   HDL 38.20 (L) 10/03/2022  LDLCALC 114 (H) 10/03/2022   ALT 7 05/02/2023   AST 18 05/02/2023   NA 138 05/02/2023   K 4.0 05/02/2023   CL 107 05/02/2023   CREATININE 0.79 05/02/2023   BUN 11 05/02/2023   CO2 25 05/02/2023   TSH 2.61 04/18/2023   Assessment/Plan:  Kelly Clay is a 48 y.o. White or Caucasian [1] female with  has a past medical history of Abnormal Pap smear, AMA (advanced maternal age) multigravida 35+, Anxiety, Complication of  anesthesia, Depression, H/O hiatal hernia, Headache(784.0), Nausea vomiting and diarrhea (05/02/2023), Preterm labor, and Urinary tract infection.  Nausea vomiting and diarrhea Etiology unclear but can't r/o colitis or gastroenteritis, or GB or gastritis;  pt without fever but symptoms at least moderate and persistent for 2 mo - now for lab eval including repeat cbc lfts, bmp ua lipase, but also Ct abd pelvis, refer GI, also zofran  odt 4 mg prn, and lomotil  prn asd  Epigastric pain Also for GI referral, may need EGD  Followup: Return if symptoms worsen or fail to improve.  Lynwood Rush, MD 01/23/2024 7:43 PM [facility]

## 2024-01-23 NOTE — Assessment & Plan Note (Signed)
 Etiology unclear but can't r/o colitis or gastroenteritis, or GB or gastritis;  pt without fever but symptoms at least moderate and persistent for 2 mo - now for lab eval including repeat cbc lfts, bmp ua lipase, but also Ct abd pelvis, refer GI, also zofran  odt 4 mg prn, and lomotil  prn asd

## 2024-01-23 NOTE — Assessment & Plan Note (Signed)
Also for GI referral, may need EGD 

## 2024-01-24 ENCOUNTER — Ambulatory Visit: Payer: Self-pay | Admitting: Internal Medicine

## 2024-01-24 DIAGNOSIS — R197 Diarrhea, unspecified: Secondary | ICD-10-CM

## 2024-01-24 LAB — URINALYSIS, ROUTINE W REFLEX MICROSCOPIC
Bilirubin Urine: NEGATIVE
Hgb urine dipstick: NEGATIVE
Ketones, ur: NEGATIVE
Leukocytes,Ua: NEGATIVE
Nitrite: NEGATIVE
Specific Gravity, Urine: 1.025 (ref 1.000–1.030)
Total Protein, Urine: NEGATIVE
Urine Glucose: NEGATIVE
Urobilinogen, UA: 0.2 (ref 0.0–1.0)
pH: 6 (ref 5.0–8.0)

## 2024-01-24 NOTE — Progress Notes (Signed)
 The test results show that your current treatment is OK, as the tests are stable.  Please continue the same plan.  There is no other need for change of treatment or further evaluation based on these results, at this time.  thanks

## 2024-01-25 ENCOUNTER — Telehealth: Payer: Self-pay

## 2024-01-25 ENCOUNTER — Encounter: Payer: Self-pay | Admitting: Gastroenterology

## 2024-01-25 ENCOUNTER — Ambulatory Visit
Admission: RE | Admit: 2024-01-25 | Discharge: 2024-01-25 | Disposition: A | Discharge status: Home / Self Care | Source: Ambulatory Visit | Attending: Internal Medicine | Admitting: Internal Medicine

## 2024-01-25 DIAGNOSIS — R112 Nausea with vomiting, unspecified: Secondary | ICD-10-CM | POA: Diagnosis not present

## 2024-01-25 DIAGNOSIS — R197 Diarrhea, unspecified: Secondary | ICD-10-CM | POA: Diagnosis not present

## 2024-01-25 DIAGNOSIS — R1013 Epigastric pain: Secondary | ICD-10-CM

## 2024-01-25 DIAGNOSIS — R1010 Upper abdominal pain, unspecified: Secondary | ICD-10-CM | POA: Diagnosis not present

## 2024-01-25 MED ORDER — DICYCLOMINE HCL 10 MG PO CAPS: 10.0000 mg | ORAL_CAPSULE | Freq: Three times a day (TID) | ORAL | 2 refills | Status: AC | PRN

## 2024-01-25 NOTE — Telephone Encounter (Signed)
 Copied from CRM [id number]. Topic: Clinical - Medical Advice >> Jan 25, 2024 10:28 AM Berneda FALCON wrote: Reason for CRM: Patient states that she is still in pain, still experiencing vomiting, diarrhea, and unable to eat much. She states even though the CT states everything was good, she is not okay and wants to know what else can be done to help her, please.  Patient callback is [phone]

## 2024-02-15 DIAGNOSIS — F331 Major depressive disorder, recurrent, moderate: Secondary | ICD-10-CM | POA: Diagnosis not present

## 2024-03-15 DIAGNOSIS — R0981 Nasal congestion: Secondary | ICD-10-CM | POA: Diagnosis not present

## 2024-03-15 DIAGNOSIS — R070 Pain in throat: Secondary | ICD-10-CM | POA: Diagnosis not present

## 2024-03-15 DIAGNOSIS — R059 Cough, unspecified: Secondary | ICD-10-CM | POA: Diagnosis not present

## 2024-03-19 ENCOUNTER — Ambulatory Visit: Admitting: Gastroenterology

## 2024-03-19 ENCOUNTER — Encounter: Payer: Self-pay | Admitting: Gastroenterology

## 2024-03-19 VITALS — BP 112/78 | HR 92 | Ht 62.0 in | Wt 159.0 lb

## 2024-03-19 DIAGNOSIS — R1011 Right upper quadrant pain: Secondary | ICD-10-CM | POA: Diagnosis not present

## 2024-03-19 DIAGNOSIS — Z1211 Encounter for screening for malignant neoplasm of colon: Secondary | ICD-10-CM

## 2024-03-19 DIAGNOSIS — R1013 Epigastric pain: Secondary | ICD-10-CM | POA: Diagnosis not present

## 2024-03-19 DIAGNOSIS — R197 Diarrhea, unspecified: Secondary | ICD-10-CM | POA: Diagnosis not present

## 2024-03-19 DIAGNOSIS — R112 Nausea with vomiting, unspecified: Secondary | ICD-10-CM | POA: Diagnosis not present

## 2024-03-19 MED ORDER — NA SULFATE-K SULFATE-MG SULF 17.5-3.13-1.6 GM/177ML PO SOLN: 1.0000 | Freq: Once | ORAL | 0 refills | Status: AC

## 2024-03-19 NOTE — Progress Notes (Signed)
 Kelly Clay [id number] 1976-05-09   Chief Complaint: Nausea, vomiting, diarrhea  Referring Provider: Rollene Almarie LABOR, * Primary GI MD: Sampson  HPI: Kelly Clay is a 48 y.o. female with past medical history of anxiety/depression, hiatal hernia who presents today for a complaint of nausea, vomiting, diarrhea.    Seen by internal medicine 01/23/2024 with complaint of 2 months of persistent symptoms at least twice a week with nausea and vomiting for 2 hours and diarrhea.  Additionally complained of mild to moderate persistent epigastric pain with possibly some pain in the RUQ.  Stop Wegovy few weeks prior but no improvement in symptoms.  Had similar symptoms late 2024 with normal LFTs and CBC.  Plan was for labs, CT abdomen/pelvis, and GI referral.  Labs 01/23/2024: Normal CBC, unremarkable HFP, normal BMP, normal lipase, normal sed rate  GI pathogen panel and C. difficile stool studies were ordered and have not yet been completed.  CT A/P 01/25/2024 showed no acute findings or explanation for patient's symptoms.   Patient states she had an episode of nausea, vomiting, and diarrhea prior to most recent episode in July.  She stopped semaglutide to see if this would help, but more than a month later had severe episode of nausea, vomiting, and diarrhea which lasted 5 to 6 days (this was in July when she saw internal medicine).  States that vomiting and diarrhea were uncontrollable and sometimes occurring at the same time.  Symptom onset after she had a couple chicken nuggets at [employer].  States diarrhea was severe and she was having some incontinence because of it as well.  Denies seeing any blood in her stool or melena.  Denies any hematemesis.  She states that she was only able to consume liquids at this time, and even then was vomiting liquids.  Slowly started adding foods back in but has been avoiding any fried foods.  In addition to the symptoms she states that  she was having a pinching pain in her RUQ.  No gallbladder findings on CT scan but she has not had RUQ ultrasound.  Also had a gritty feeling in her epigastrium.  Reports that she had similar symptoms last October, at which time she was hospitalized due to complaint of chest pain.  States that pain was radiating to her RUQ at that time.  Has family history significant of early CAD.  On presentation to the ED she was afebrile with heart rate in the 70s to 80s, BP 110/80s, no supplemental oxygen required.  Troponins negative x 2.  CXR unremarkable.  EKG unremarkable.  She underwent CCTA with no evidence of CAD and calcium  score of 0.  Patient reports that over the weekend she had some generalized abdominal pain/cramping as well as diarrhea.  Last night had a large bowel movement which was formed.  States that her bowel habits are not consistent.  Typically has a bowel movement every day or every other day, sometimes having formed stools, sometimes loose/mushy but is having less diarrhea now.  Denies any recurrence of nausea.  Did start back on Wegovy and denies any side effects of medication.  Denies any heartburn or reflux.  States she has had this in the past but has not been a problem recently with avoidance of certain foods.  Denies any fever or chills during episodes of nausea/vomiting/diarrhea.  Denies family history of esophageal, stomach, or colon cancer.  Does have some aunts and cousins with history of gallbladder disease.  She reports having an  EGD and colonoscopy at age 65 at which time she was told she had a hiatal hernia but states that otherwise exam was normal.  She has not had colonoscopy to screen for colon cancer since turning 46.  Previous GI Procedures/Imaging   CT A/P 01/25/2024 1. No acute findings or explanation for the patient's symptoms. 2. Thinly septated cyst or adjacent follicles within the right ovary measuring up to 4.1 cm in overall diameter. No concerning  features or surrounding inflammatory changes. 3. Intrauterine device in place.   Past Medical History:  Diagnosis Date   Abnormal Pap smear    colpo   AMA (advanced maternal age) multigravida 35+    Anxiety    Complication of anesthesia    epidurals did not take   Depression    H/O hiatal hernia    Headache(784.0)    Nausea vomiting and diarrhea 05/02/2023   Preterm labor    induced at 36wks, PI[facility]   Urinary tract infection     Past Surgical History:  Procedure Laterality Date   AUGMENTATION MAMMAPLASTY Bilateral    2023   CESAREAN SECTION  07/26/2012   Procedure: CESAREAN SECTION;  Surgeon: Krystal Deaner, MD;  Location: [facility];  Service: Obstetrics;  Laterality: N/A;  Primary Cesarean Section Delivery Baby Girl @ [phone], Apgars 9/9   DILATION AND CURETTAGE OF UTERUS     TONSILLECTOMY      Current Outpatient Medications  Medication Sig Dispense Refill   amphetamine-dextroamphetamine (ADDERALL XR) 15 MG 24 hr capsule Take 15 mg by mouth every morning.     APLENZIN  522 MG TB24 Take 522 mg by mouth daily.     cloNIDine  (CATAPRES ) 0.1 MG tablet Take 0.1 mg by mouth at bedtime.     dicyclomine  (BENTYL ) 10 MG capsule Take 1 capsule (10 mg total) by mouth 3 (three) times daily as needed for spasms. 90 capsule 2   diphenoxylate -atropine  (LOMOTIL ) 2.5-0.025 MG tablet Take 2 tablets by mouth 4 (four) times daily as needed for diarrhea or loose stools. 60 tablet 0   ibuprofen  (ADVIL ,MOTRIN ) 200 MG tablet Take 400 mg by mouth daily as needed for mild pain.     lamoTRIgine (LAMICTAL PO) Take by mouth at bedtime.     levonorgestrel  (MIRENA ) 20 MCG/24HR IUD 1 each by Intrauterine route once.     Na Sulfate-K Sulfate-Mg Sulfate concentrate (SUPREP BOWEL PREP KIT) 17.5-3.13-1.6 GM/177ML SOLN Take 1 kit (354 mLs total) by mouth once for 1 dose. 344 mL 0   ondansetron  (ZOFRAN -ODT) 4 MG disintegrating tablet Take 1 tablet (4 mg total) by mouth every 8 (eight) hours as needed. 30 tablet 2    Semaglutide,0.25 or 0.5MG /DOS, (OZEMPIC, 0.25 OR 0.5 MG/DOSE,) 2 MG/1.5ML SOPN Inject 0.5 mg into the skin every Wednesday.     semaglutide-weight management (WEGOVY) 1.7 MG/0.75ML SOAJ SQ injection Inject 1.7 mg into the skin.     traZODone  (DESYREL ) 50 MG tablet Take 50-100 mg by mouth at bedtime as needed for sleep.     Vitamin D , Ergocalciferol , 50000 units CAPS Take 1 capsule by mouth once a week.     VYVANSE 30 MG capsule Take 30 mg by mouth daily.     ALPRAZolam (XANAX) 0.5 MG tablet Take 0.5 mg by mouth 2 (two) times daily as needed for anxiety. (Patient not taking: Reported on 03/19/2024)     aspirin  EC 81 MG tablet Take 1 tablet (81 mg total) by mouth daily. Swallow whole. (Patient not taking: Reported on 03/19/2024) 90 tablet  3   nitroGLYCERIN  (NITROSTAT ) 0.4 MG SL tablet Place 1 tablet (0.4 mg total) under the tongue every 5 (five) minutes as needed. (Patient not taking: Reported on 03/19/2024) 25 tablet 3   rosuvastatin  (CRESTOR ) 10 MG tablet Take 1 tablet (10 mg total) by mouth daily. (Patient not taking: Reported on 03/19/2024) 90 tablet 3   No current facility-administered medications for this visit.    Allergies as of 03/19/2024 - Review Complete 03/19/2024  Allergen Reaction Noted   Sumatriptan Anaphylaxis, Swelling, and Other (See Comments) 02/11/2012   Terbutaline   07/26/2012   Latex Itching and Rash 10/16/2018    Family History  Problem Relation Age of Onset   Hypertension Mother    COPD Mother    Heart disease Mother    Thyroid  disease Mother    Hypertension Father    Diabetes Father    Birth defects Son        clubbed feet   Cancer Maternal Grandmother        breast   Breast cancer Maternal Grandmother    Other Neg Hx     Social History   Tobacco Use   Smoking status: Never   Smokeless tobacco: Never  Vaping Use   Vaping status: Never Used  Substance Use Topics   Alcohol use: Yes    Comment: social   Drug use: No     Review of Systems:     Constitutional: No unexplained weight loss, fever, chills Cardiovascular: No chest pain  Respiratory: No SOB Gastrointestinal: See HPI and otherwise negative Hematologic: No bleeding    Physical Exam:  Vital signs: BP 112/78   Pulse 92   Ht 5' 2 (1.575 m)   Wt 159 lb (72.1 kg)   BMI 29.08 kg/m   Constitutional: Pleasant, overweight female in NAD, alert and cooperative Head:  Normocephalic and atraumatic.  Eyes: No scleral icterus.  Respiratory: Respirations even and unlabored. Lungs clear to auscultation bilaterally.  No wheezes, crackles, or rhonchi.  Cardiovascular:  Regular rate and rhythm. No murmurs. No peripheral edema. Gastrointestinal:  Soft, nondistended, nontender. No rebound or guarding. Normal bowel sounds. No appreciable masses or hepatomegaly. Rectal:  Not performed.  Neurologic:  Alert and oriented x4;  grossly normal neurologically.  Skin:   Dry and intact without significant lesions or rashes. Psychiatric: Oriented to person, place and time. Demonstrates good judgement and reason without abnormal affect or behaviors.   RELEVANT LABS AND IMAGING: CBC    Component Value Date/Time   WBC 7.0 01/23/2024 1559   RBC 4.44 01/23/2024 1559   HGB 13.6 01/23/2024 1559   HCT 40.0 01/23/2024 1559   PLT 305.0 01/23/2024 1559   MCV 90.1 01/23/2024 1559   MCH 30.6 04/25/2023 0020   MCHC 34.0 01/23/2024 1559   RDW 12.0 01/23/2024 1559   LYMPHSABS 2.3 01/23/2024 1559   MONOABS 0.4 01/23/2024 1559   EOSABS 0.3 01/23/2024 1559   BASOSABS 0.0 01/23/2024 1559    CMP     Component Value Date/Time   NA 137 01/23/2024 1559   K 4.0 01/23/2024 1559   CL 106 01/23/2024 1559   CO2 29 01/23/2024 1559   GLUCOSE 97 01/23/2024 1559   BUN 9 01/23/2024 1559   CREATININE 0.74 01/23/2024 1559   CALCIUM  8.4 01/23/2024 1559   PROT 6.2 01/23/2024 1559   ALBUMIN 4.0 01/23/2024 1559   AST 13 01/23/2024 1559   ALT 4 01/23/2024 1559   ALKPHOS 36 (L) 01/23/2024 1559   BILITOT 0.5  01/23/2024  1559   GFRNONAA >60 04/26/2023 1002   GFRAA >60 10/16/2018 2115   Echocardiogram 07/10/2023 1. Left ventricular ejection fraction, by estimation, is 60 to 65% . The left ventricle has normal function. The left ventricle has no regional wall motion abnormalities. Left ventricular diastolic parameters were normal. 2. Right ventricular systolic function is normal. The right ventricular size is normal.  3. Left atrial size was mildly dilated.  4. The mitral valve is normal in structure. No evidence of mitral valve regurgitation. No evidence of mitral stenosis.  5. The aortic valve is normal in structure. Aortic valve regurgitation is not visualized. No aortic stenosis is present. 6. The inferior vena cava is normal in size with greater than 50% respiratory variability, suggesting right atrial pressure of 3 mmHg.  Assessment/Plan:   RUQ abdominal pain Epigastric pain Intermittent nausea and vomiting Intermittent diarrhea Patient reports recent intermittent episodes of severe nausea, vomiting, and diarrhea.  Currently symptoms are resolved, but when she had symptoms states she was hardly able to even consume fluids due to severe vomiting.  Possibly some association with eating fried foods.  Had CT scan which was unremarkable and normal labs. Has not had ultrasound of the gallbladder.  Does have some family history of gallbladder disease.  Has had some intermittent epigastric pain as well but denies any acid reflux or heartburn. Reports having an EGD at age 54 with finding of hiatal hernia and otherwise normal.  - Schedule EGD. I thoroughly discussed the procedure with the patient to include nature of the procedure, alternatives, benefits, and risks (including but not limited to bleeding, infection, perforation, anesthesia/cardiac/pulmonary complications). Patient verbalized understanding and gave verbal consent to proceed with procedure.  - Will need to hold [facility] prior to procedures -  Order RUQ US  to evaluate for gallstones which may not have been seen on CT - If symptoms persist without explanation, consider HIDA scan  Screening for colon cancer Patient reports having colonoscopy at age 23, but is due for screening colonoscopy.    - Schedule colonoscopy. I thoroughly discussed the procedure with the patient to include nature of the procedure, alternatives, benefits, and risks (including but not limited to bleeding, infection, perforation, anesthesia/cardiac/pulmonary complications). Patient verbalized understanding and gave verbal consent to proceed with procedure.    Camie Furbish, PA-C [facility] 03/19/2024, 1:47 PM  Patient Care Team: Rollene Almarie LABOR, MD as PCP - General (Internal Medicine) Elmira Newman PARAS, MD as PCP - Cardiology (Cardiology)

## 2024-03-19 NOTE — Patient Instructions (Signed)
 You have been scheduled for an endoscopy and colonoscopy. Please follow the written instructions given to you at your visit today.  If you use inhalers (even only as needed), please bring them with you on the day of your procedure.  DO NOT TAKE 7 DAYS PRIOR TO TEST- Trulicity (dulaglutide) Ozempic, Wegovy (semaglutide) Mounjaro (tirzepatide) Bydureon Bcise (exanatide extended release)  DO NOT TAKE 1 DAY PRIOR TO YOUR TEST Rybelsus (semaglutide) Adlyxin (lixisenatide) Victoza (liraglutide) Byetta (exanatide) ___________________________________________________________________________    Rosine will be contacted by [facility] in the next 2 days to arrange a Abdominal Ultrasound.  The number on your caller ID will be [phone], please answer when they call.  If you have not heard from them in 2 days please call [phone] to schedule.  Due to recent changes in healthcare laws, you may see the results of your imaging and laboratory studies on MyChart before your provider has had a chance to review them.  We understand that in some cases there may be results that are confusing or concerning to you. Not all laboratory results come back in the same time frame and the provider may be waiting for multiple results in order to interpret others.  Please give us  48 hours in order for your provider to thoroughly review all the results before contacting the office for clarification of your results.    Follow up after procedure per recommendations after procedure  I appreciate the  opportunity to care for you  Thank You   Camie Heinz,PA-C

## 2024-04-02 DIAGNOSIS — J069 Acute upper respiratory infection, unspecified: Secondary | ICD-10-CM | POA: Diagnosis not present

## 2024-04-11 DIAGNOSIS — F331 Major depressive disorder, recurrent, moderate: Secondary | ICD-10-CM | POA: Diagnosis not present

## 2024-04-12 ENCOUNTER — Encounter: Payer: Self-pay | Admitting: Gastroenterology

## 2024-04-15 ENCOUNTER — Ambulatory Visit (HOSPITAL_COMMUNITY)

## 2024-04-19 ENCOUNTER — Ambulatory Visit (AMBULATORY_SURGERY_CENTER): Admitting: Gastroenterology

## 2024-04-19 ENCOUNTER — Encounter: Payer: Self-pay | Admitting: Gastroenterology

## 2024-04-19 VITALS — BP 125/88 | HR 86 | Temp 98.3°F | Resp 14

## 2024-04-19 DIAGNOSIS — K648 Other hemorrhoids: Secondary | ICD-10-CM

## 2024-04-19 DIAGNOSIS — R197 Diarrhea, unspecified: Secondary | ICD-10-CM

## 2024-04-19 DIAGNOSIS — K3189 Other diseases of stomach and duodenum: Secondary | ICD-10-CM

## 2024-04-19 DIAGNOSIS — R112 Nausea with vomiting, unspecified: Secondary | ICD-10-CM

## 2024-04-19 DIAGNOSIS — G8929 Other chronic pain: Secondary | ICD-10-CM

## 2024-04-19 DIAGNOSIS — Z1211 Encounter for screening for malignant neoplasm of colon: Secondary | ICD-10-CM

## 2024-04-19 DIAGNOSIS — D122 Benign neoplasm of ascending colon: Secondary | ICD-10-CM | POA: Diagnosis not present

## 2024-04-19 DIAGNOSIS — K644 Residual hemorrhoidal skin tags: Secondary | ICD-10-CM | POA: Diagnosis not present

## 2024-04-19 MED ORDER — SODIUM CHLORIDE 0.9 % IV SOLN: 500.0000 mL | Freq: Once | INTRAVENOUS | Status: DC

## 2024-04-19 NOTE — Progress Notes (Signed)
 Called to room to assist during endoscopic procedure.  Patient ID and intended procedure confirmed with present staff. Received instructions for my participation in the procedure from the performing physician.

## 2024-04-19 NOTE — Progress Notes (Signed)
 Report given to PACU, vss

## 2024-04-19 NOTE — Op Note (Signed)
 [facility] Patient Name: Kelly Clay Procedure Date: 04/19/2024 2:49 PM MRN: [id number] Endoscopist: Gustav ALONSO Mcgee , MD, [id number] Age: 48 Referring MD:  Date of Birth: 03-Aug-1975 Gender: Female Account #: [id number] Procedure:                Colonoscopy Indications:              Screening for colorectal malignant neoplasm,                            Incidental diarrhea noted Medicines:                Monitored Anesthesia Care Procedure:                Pre-Anesthesia Assessment:                           - Prior to the procedure, a History and Physical                            was performed, and patient medications and                            allergies were reviewed. The patient's tolerance of                            previous anesthesia was also reviewed. The risks                            and benefits of the procedure and the sedation                            options and risks were discussed with the patient.                            All questions were answered, and informed consent                            was obtained. Prior Anticoagulants: The patient has                            taken no anticoagulant or antiplatelet agents. ASA                            Grade Assessment: II - A patient with mild systemic                            disease. After reviewing the risks and benefits,                            the patient was deemed in satisfactory condition to                            undergo the procedure.  After obtaining informed consent, the colonoscope                            was passed under direct vision. Throughout the                            procedure, the patient's blood pressure, pulse, and                            oxygen saturations were monitored continuously. The                            Olympus Scope [id number] was introduced through the                            anus and advanced to the the  terminal ileum, with                            identification of the appendiceal orifice and IC                            valve. The colonoscopy was performed without                            difficulty. The patient tolerated the procedure                            well. The quality of the bowel preparation was                            good. The ileocecal valve, appendiceal orifice, and                            rectum were photographed. Scope In: 3:12:26 PM Scope Out: 3:24:56 PM Scope Withdrawal Time: 0 hours 8 minutes 19 seconds  Total Procedure Duration: 0 hours 12 minutes 30 seconds  Findings:                 The perianal and digital rectal examinations were                            normal.                           A 5 mm polyp was found in the ascending colon. The                            polyp was sessile. The polyp was removed with a                            cold snare. Resection and retrieval were complete.                           Normal mucosa was found in the entire colon.  Biopsies for histology were taken with a cold                            forceps from the entire colon for evaluation of                            microscopic colitis.                           Non-bleeding external and internal hemorrhoids were                            found during retroflexion. The hemorrhoids were                            small. Complications:            No immediate complications. Estimated Blood Loss:     Estimated blood loss was minimal. Impression:               - One 5 mm polyp in the ascending colon, removed                            with a cold snare. Resected and retrieved.                           - Normal mucosa in the entire examined colon.                            Biopsied.                           - Non-bleeding external and internal hemorrhoids. Recommendation:           - Patient has a contact number available for                             emergencies. The signs and symptoms of potential                            delayed complications were discussed with the                            patient. Return to normal activities tomorrow.                            Written discharge instructions were provided to the                            patient.                           - Resume previous diet.                           - Continue present medications.                           -  Await pathology results.                           - Repeat colonoscopy in 5-10 years for surveillance                            based on pathology results. Caprice Wasko V. Kemaria Dedic, MD 04/19/2024 3:32:36 PM This report has been signed electronically.

## 2024-04-19 NOTE — Progress Notes (Signed)
 [facility] History and Physical   Primary Care Physician:  Rollene Almarie LABOR, MD   Reason for Procedure:  Nausea, vomiting, diarrhea, epigastric pain  Plan:    EGD and colonoscopy with possible interventions as needed     HPI: Kelly Clay is a very pleasant 48 y.o. female here for EGD and colonoscopy for evaluation of nausea, vomiting, diarrhea and abdominal pain.  Please refer to office visit note Camie Furbish for additional details.  The risks and benefits as well as alternatives of endoscopic procedure(s) have been discussed and reviewed. All questions answered. The patient agrees to proceed.    Past Medical History:  Diagnosis Date   Abnormal Pap smear    colpo   AMA (advanced maternal age) multigravida 35+    Anxiety    Complication of anesthesia    epidurals did not take   Depression    H/O hiatal hernia    Headache(784.0)    Hyperlipidemia    Nausea vomiting and diarrhea 05/02/2023   Preterm labor    induced at 36wks, PIH   Urinary tract infection     Past Surgical History:  Procedure Laterality Date   AUGMENTATION MAMMAPLASTY Bilateral    2023   CESAREAN SECTION  07/26/2012   Procedure: CESAREAN SECTION;  Surgeon: Krystal Deaner, MD;  Location: [facility];  Service: Obstetrics;  Laterality: N/A;  Primary Cesarean Section Delivery Baby Girl @ [phone], Apgars 9/9   DILATION AND CURETTAGE OF UTERUS     TONSILLECTOMY      Prior to Admission medications   Medication Sig Start Date End Date Taking? Authorizing Provider  amphetamine-dextroamphetamine (ADDERALL XR) 15 MG 24 hr capsule Take 15 mg by mouth every morning. 04/19/23  Yes [provider]  APLENZIN  522 MG TB24 Take 522 mg by mouth daily. 09/29/18  Yes [provider]  cloNIDine  (CATAPRES ) 0.1 MG tablet Take 0.1 mg by mouth at bedtime.   Yes [provider]  ibuprofen  (ADVIL ,MOTRIN ) 200 MG tablet Take 400 mg by mouth daily as needed for mild pain.   Yes [provider]  lamoTRIgine (LAMICTAL PO) Take by mouth at bedtime.   Yes [provider]  levonorgestrel  (MIRENA ) 20 MCG/24HR IUD 1 each by Intrauterine route once.   Yes [provider]  traZODone  (DESYREL ) 50 MG tablet Take 50-100 mg by mouth at bedtime as needed for sleep. 07/20/10  Yes [provider]  Vitamin D , Ergocalciferol , 50000 units CAPS Take 1 capsule by mouth once a week. 04/27/23  Yes [provider]  VYVANSE 30 MG capsule Take 30 mg by mouth daily. 10/02/18  Yes [provider]  ALPRAZolam (XANAX) 0.5 MG tablet Take 0.5 mg by mouth 2 (two) times daily as needed for anxiety. Patient not taking: No sig reported 09/29/18   [provider]  aspirin  EC 81 MG tablet Take 1 tablet (81 mg total) by mouth daily. Swallow whole. Patient not taking: No sig reported 04/19/23   Patwardhan, Newman PARAS, MD  dicyclomine  (BENTYL ) 10 MG capsule Take 1 capsule (10 mg total) by mouth 3 (three) times daily as needed for spasms. 01/25/24   Norleen Lynwood ORN, MD  diphenoxylate -atropine  (LOMOTIL ) 2.5-0.025 MG tablet Take 2 tablets by mouth 4 (four) times daily as needed for diarrhea or loose stools. 01/23/24   Norleen Lynwood ORN, MD  nitroGLYCERIN  (NITROSTAT ) 0.4 MG SL tablet Place 1 tablet (0.4 mg total) under the tongue every 5 (five) minutes as needed. Patient not taking: No sig  reported 04/19/23   Patwardhan, Newman PARAS, MD  ondansetron  (ZOFRAN -ODT) 4 MG disintegrating tablet Take 1 tablet (4 mg total) by mouth every 8 (eight) hours as needed. 01/23/24   Norleen Lynwood ORN, MD  rosuvastatin  (CRESTOR ) 10 MG tablet Take 1 tablet (10 mg total) by mouth daily. Patient not taking: No sig reported 05/26/23   Lucien Orren SAILOR, PA-C  Semaglutide,0.25 or 0.5MG /DOS, (OZEMPIC, 0.25 OR 0.5 MG/DOSE,) 2 MG/1.5ML SOPN Inject 0.5 mg into the skin every Wednesday. Patient not taking: Reported on 04/19/2024    [provider]  semaglutide-weight management (WEGOVY) 1.7 MG/0.75ML SOAJ  SQ injection Inject 1.7 mg into the skin.    [provider]    Current Outpatient Medications  Medication Sig Dispense Refill   amphetamine-dextroamphetamine (ADDERALL XR) 15 MG 24 hr capsule Take 15 mg by mouth every morning.     APLENZIN  522 MG TB24 Take 522 mg by mouth daily.     cloNIDine  (CATAPRES ) 0.1 MG tablet Take 0.1 mg by mouth at bedtime.     ibuprofen  (ADVIL ,MOTRIN ) 200 MG tablet Take 400 mg by mouth daily as needed for mild pain.     lamoTRIgine (LAMICTAL PO) Take by mouth at bedtime.     levonorgestrel  (MIRENA ) 20 MCG/24HR IUD 1 each by Intrauterine route once.     traZODone  (DESYREL ) 50 MG tablet Take 50-100 mg by mouth at bedtime as needed for sleep.     Vitamin D , Ergocalciferol , 50000 units CAPS Take 1 capsule by mouth once a week.     VYVANSE 30 MG capsule Take 30 mg by mouth daily.     ALPRAZolam (XANAX) 0.5 MG tablet Take 0.5 mg by mouth 2 (two) times daily as needed for anxiety. (Patient not taking: No sig reported)     aspirin  EC 81 MG tablet Take 1 tablet (81 mg total) by mouth daily. Swallow whole. (Patient not taking: No sig reported) 90 tablet 3   dicyclomine  (BENTYL ) 10 MG capsule Take 1 capsule (10 mg total) by mouth 3 (three) times daily as needed for spasms. 90 capsule 2   diphenoxylate -atropine  (LOMOTIL ) 2.5-0.025 MG tablet Take 2 tablets by mouth 4 (four) times daily as needed for diarrhea or loose stools. 60 tablet 0   nitroGLYCERIN  (NITROSTAT ) 0.4 MG SL tablet Place 1 tablet (0.4 mg total) under the tongue every 5 (five) minutes as needed. (Patient not taking: No sig reported) 25 tablet 3   ondansetron  (ZOFRAN -ODT) 4 MG disintegrating tablet Take 1 tablet (4 mg total) by mouth every 8 (eight) hours as needed. 30 tablet 2   rosuvastatin  (CRESTOR ) 10 MG tablet Take 1 tablet (10 mg total) by mouth daily. (Patient not taking: No sig reported) 90 tablet 3   Semaglutide,0.25 or 0.5MG /DOS, (OZEMPIC, 0.25 OR 0.5 MG/DOSE,) 2 MG/1.5ML SOPN Inject 0.5 mg into  the skin every Wednesday. (Patient not taking: Reported on 04/19/2024)     semaglutide-weight management (WEGOVY) 1.7 MG/0.75ML SOAJ SQ injection Inject 1.7 mg into the skin.     Current Facility-Administered Medications  Medication Dose Route Frequency Provider Last Rate Last Admin   0.9 %  sodium chloride  infusion  500 mL Intravenous Once Imagene Boss V, MD        Allergies as of 04/19/2024 - Review Complete 04/19/2024  Allergen Reaction Noted   Sumatriptan Anaphylaxis, Swelling, and Other (See Comments) 02/11/2012   Terbutaline  Other (See Comments) 07/26/2012   Latex Itching and Rash 10/16/2018    Family History  Problem Relation Age of Onset  Hypertension Mother    COPD Mother    Heart disease Mother    Thyroid  disease Mother    Hypertension Father    Diabetes Father    Cancer Maternal Grandmother        breast   Breast cancer Maternal Grandmother    Birth defects Son        clubbed feet   Other Neg Hx    Colon cancer Neg Hx    Esophageal cancer Neg Hx    Rectal cancer Neg Hx    Stomach cancer Neg Hx     Social History   Socioeconomic History   Marital status: Divorced    Spouse name: Not on file   Number of children: Not on file   Years of education: Not on file   Highest education level: Not on file  Occupational History   Occupation: customer service  Tobacco Use   Smoking status: Never   Smokeless tobacco: Never  Vaping Use   Vaping status: Never Used  Substance and Sexual Activity   Alcohol use: Yes    Comment: social   Drug use: No   Sexual activity: Yes  Other Topics Concern   Not on file  Social History Narrative   Not on file   Social Drivers of Health   Financial Resource Strain: Not on file  Food Insecurity: No Food Insecurity (04/25/2023)   Hunger Vital Sign    Worried About Running Out of Food in the Last Year: Never true    Ran Out of Food in the Last Year: Never true  Transportation Needs: No Transportation Needs (04/25/2023)    PRAPARE - Administrator, Civil Service (Medical): No    Lack of Transportation (Non-Medical): No  Physical Activity: Not on file  Stress: Not on file  Social Connections: Not on file  Intimate Partner Violence: Not At Risk (04/25/2023)   Humiliation, Afraid, Rape, and Kick questionnaire    Fear of Current or Ex-Partner: No    Emotionally Abused: No    Physically Abused: No    Sexually Abused: No    Review of Systems:  All other review of systems negative except as mentioned in the HPI.  Physical Exam: Vital signs in last 24 hours: Temp 98.3 F (36.8 C)  General:   Alert, NAD Lungs:  Clear .   Heart:  Regular rate and rhythm Abdomen:  Soft, nontender and nondistended. Neuro/Psych:  Alert and cooperative. Normal mood and affect. A and O x 3  Reviewed labs, radiology imaging, old records and pertinent past GI work up  Patient is appropriate for planned procedure(s) and anesthesia in an ambulatory setting   K. Veena Klint Lezcano , MD [phone]

## 2024-04-19 NOTE — Op Note (Signed)
 [facility] Patient Name: Kelly Clay Procedure Date: 04/19/2024 2:57 PM MRN: [id number] Endoscopist: Gustav ALONSO Mcgee , MD, [id number] Age: 48 Referring MD:  Date of Birth: 05-Jan-1976 Gender: Female Account #: [id number] Procedure:                Upper GI endoscopy Indications:              Epigastric abdominal pain, Abdominal pain in the                            right upper quadrant, Diarrhea, Nausea with vomiting Medicines:                Monitored Anesthesia Care Procedure:                Pre-Anesthesia Assessment:                           - Prior to the procedure, a History and Physical                            was performed, and patient medications and                            allergies were reviewed. The patient's tolerance of                            previous anesthesia was also reviewed. The risks                            and benefits of the procedure and the sedation                            options and risks were discussed with the patient.                            All questions were answered, and informed consent                            was obtained. Prior Anticoagulants: The patient has                            taken no anticoagulant or antiplatelet agents. ASA                            Grade Assessment: II - A patient with mild systemic                            disease. After reviewing the risks and benefits,                            the patient was deemed in satisfactory condition to                            undergo the procedure.  After obtaining informed consent, the endoscope was                            passed under direct vision. Throughout the                            procedure, the patient's blood pressure, pulse, and                            oxygen saturations were monitored continuously. The                            GIF [id number] #[id number] was introduced through the                             mouth, and advanced to the second part of duodenum.                            The upper GI endoscopy was accomplished without                            difficulty. The patient tolerated the procedure                            well. Scope In: Scope Out: Findings:                 The Z-line was regular and was found 36 cm from the                            incisors.                           The examined esophagus was normal.                           The stomach was normal.                           The cardia and gastric fundus were normal on                            retroflexion.                           Decreased folds were found in the first portion of                            the duodenum and decreased folds were found in the                            second portion of the duodenum. Biopsies for                            histology were taken with a cold forceps for  evaluation of celiac disease. Complications:            No immediate complications. Estimated Blood Loss:     Estimated blood loss was minimal. Impression:               - Z-line regular, 36 cm from the incisors.                           - Normal esophagus.                           - Normal stomach.                           - Duodenal mucosal changes seen, rule out celiac                            disease. Biopsied. Recommendation:           - Resume previous diet.                           - Continue present medications.                           - Await pathology results.                           - Return to GI office in 1 month. Yetunde Leis V. [place] Kirchner, MD 04/19/2024 3:39:13 PM This report has been signed electronically.

## 2024-04-19 NOTE — Progress Notes (Signed)
1455 Robinul 0.1 mg IV given due large amount of secretions upon assessment.  MD made aware, vss  

## 2024-04-19 NOTE — Patient Instructions (Addendum)
-  Handout on polyps and hemorrhoids provided -Return to GI office in 1 month -Await pathology results  YOU HAD AN ENDOSCOPIC PROCEDURE TODAY AT THE [facility]:   Refer to the procedure report that was given to you for any specific questions about what was found during the examination.  If the procedure report does not answer your questions, please call your gastroenterologist to clarify.  If you requested that your care partner not be given the details of your procedure findings, then the procedure report has been included in a sealed envelope for you to review at your convenience later.  YOU SHOULD EXPECT: Some feelings of bloating in the abdomen. Passage of more gas than usual.  Walking can help get rid of the air that was put into your GI tract during the procedure and reduce the bloating. If you had a lower endoscopy (such as a colonoscopy or flexible sigmoidoscopy) you may notice spotting of blood in your stool or on the toilet paper. If you underwent a bowel prep for your procedure, you may not have a normal bowel movement for a few days.  Please Note:  You might notice some irritation and congestion in your nose or some drainage.  This is from the oxygen used during your procedure.  There is no need for concern and it should clear up in a day or so.  SYMPTOMS TO REPORT IMMEDIATELY:  Following lower endoscopy (colonoscopy or flexible sigmoidoscopy):  Excessive amounts of blood in the stool  Significant tenderness or worsening of abdominal pains  Swelling of the abdomen that is new, acute  Fever of 100F or higher  Following upper endoscopy (EGD)  Vomiting of blood or coffee ground material  New chest pain or pain under the shoulder blades  Painful or persistently difficult swallowing  New shortness of breath  Fever of 100F or higher  Black, tarry-looking stools  For urgent or emergent issues, a gastroenterologist can be reached at any hour by calling [phone]. Do  not use MyChart messaging for urgent concerns.    DIET:  We do recommend a small meal at first, but then you may proceed to your regular diet.  Drink plenty of fluids but you should avoid alcoholic beverages for 24 hours.  ACTIVITY:  You should plan to take it easy for the rest of today and you should NOT DRIVE or use heavy machinery until tomorrow (because of the sedation medicines used during the test).    FOLLOW UP: Our staff will call the number listed on your records the next business day following your procedure.  We will call around 7:15- 8:00 am to check on you and address any questions or concerns that you may have regarding the information given to you following your procedure. If we do not reach you, we will leave a message.     If any biopsies were taken you will be contacted by phone or by letter within the next 1-3 weeks.  Please call us  at [phone] if you have not heard about the biopsies in 3 weeks.    SIGNATURES/CONFIDENTIALITY: You and/or your care partner have signed paperwork which will be entered into your electronic medical record.  These signatures attest to the fact that that the information above on your After Visit Summary has been reviewed and is understood.  Full responsibility of the confidentiality of this discharge information lies with you and/or your care-partner.

## 2024-04-22 ENCOUNTER — Telehealth: Payer: Self-pay

## 2024-04-22 NOTE — Telephone Encounter (Signed)
  Follow up Call-     04/19/2024    2:07 PM  Call back number  Post procedure Call Back phone  # [phone]  Permission to leave phone message Yes     Patient questions:  Do you have a fever, pain , or abdominal swelling? No. Pain Score  0 *  Have you tolerated food without any problems? Yes.    Have you been able to return to your normal activities? Yes.    Do you have any questions about your discharge instructions: Diet   No. Medications  No. Follow up visit  No.  Do you have questions or concerns about your Care? No.  Actions: * If pain score is 4 or above: No action needed, pain <4.

## 2024-04-24 ENCOUNTER — Ambulatory Visit: Payer: Self-pay | Admitting: Gastroenterology

## 2024-04-24 LAB — SURGICAL PATHOLOGY

## 2024-04-29 ENCOUNTER — Ambulatory Visit (HOSPITAL_COMMUNITY)
Admission: RE | Admit: 2024-04-29 | Discharge: 2024-04-29 | Disposition: A | Discharge status: Home / Self Care | Source: Ambulatory Visit | Attending: Gastroenterology | Admitting: Gastroenterology

## 2024-04-29 DIAGNOSIS — R1013 Epigastric pain: Secondary | ICD-10-CM | POA: Insufficient documentation

## 2024-04-29 DIAGNOSIS — R112 Nausea with vomiting, unspecified: Secondary | ICD-10-CM | POA: Insufficient documentation

## 2024-04-29 DIAGNOSIS — R1011 Right upper quadrant pain: Secondary | ICD-10-CM | POA: Diagnosis not present

## 2024-04-30 ENCOUNTER — Ambulatory Visit: Payer: Self-pay | Admitting: Gastroenterology

## 2024-05-08 DIAGNOSIS — F900 Attention-deficit hyperactivity disorder, predominantly inattentive type: Secondary | ICD-10-CM | POA: Diagnosis not present

## 2024-06-11 ENCOUNTER — Ambulatory Visit: Admitting: Gastroenterology

## 2024-06-11 ENCOUNTER — Encounter: Payer: Self-pay | Admitting: Gastroenterology

## 2024-06-11 VITALS — BP 92/58 | HR 90 | Ht 62.0 in | Wt 155.5 lb

## 2024-06-11 DIAGNOSIS — K648 Other hemorrhoids: Secondary | ICD-10-CM

## 2024-06-11 DIAGNOSIS — R112 Nausea with vomiting, unspecified: Secondary | ICD-10-CM

## 2024-06-11 DIAGNOSIS — R1011 Right upper quadrant pain: Secondary | ICD-10-CM | POA: Diagnosis not present

## 2024-06-11 DIAGNOSIS — R197 Diarrhea, unspecified: Secondary | ICD-10-CM | POA: Diagnosis not present

## 2024-06-11 NOTE — Patient Instructions (Signed)
 We are referring you to [facility].  They will contact you directly to schedule an appointment.  It may take a week or more before you hear from them.  Please feel free to contact us  if you have not heard from them within 2 weeks and we will follow up on the referral.   Due to recent changes in healthcare laws, you may see the results of your imaging and laboratory studies on MyChart before your provider has had a chance to review them.  We understand that in some cases there may be results that are confusing or concerning to you. Not all laboratory results come back in the same time frame and the provider may be waiting for multiple results in order to interpret others.  Please give us  48 hours in order for your provider to thoroughly review all the results before contacting the office for clarification of your results.   _______________________________________________________  If your blood pressure at your visit was 140/90 or greater, please contact your primary care physician to follow up on this.  _______________________________________________________  If you are age 47 or older, your body mass index should be between 23-30. Your Body mass index is 28.44 kg/m. If this is out of the aforementioned range listed, please consider follow up with your Primary Care Provider.  If you are age 47 or younger, your body mass index should be between 19-25. Your Body mass index is 28.44 kg/m. If this is out of the aformentioned range listed, please consider follow up with your Primary Care Provider.   ________________________________________________________  The [facility] providers would like to encourage you to use MYCHART to communicate with providers for non-urgent requests or questions.  Due to long hold times on the telephone, sending your provider a message by [facility] may be a faster and more efficient way to get a response.  Please allow 48 business hours for a response.  Please  remember that this is for non-urgent requests.  _______________________________________________________  [facility] is using a team-based approach to care.  Your team is made up of your doctor and two to three APPS. Our APPS (Nurse Practitioners and Physician Assistants) work with your physician to ensure care continuity for you. They are fully qualified to address your health concerns and develop a treatment plan. They communicate directly with your gastroenterologist to care for you. Seeing the Advanced Practice Practitioners on your physician's team can help you by facilitating care more promptly, often allowing for earlier appointments, access to diagnostic testing, procedures, and other specialty referrals.   Thank you for choosing me and [facility].  Camie Furbish, PA-C

## 2024-06-11 NOTE — Progress Notes (Signed)
 Kelly Clay [id number] 1975-07-13   Chief Complaint: Abdominal pain, nausea and vomiting, diarrhea  Referring Provider: Rollene Almarie LABOR, * Primary GI MD: Dr. Shila  HPI: Kelly Clay is a 48 y.o. female with past medical history of anxiety/depression, hiatal hernia who presents today for a complaint of abdominal pain, nausea and vomiting, diarrhea.    Seen by internal medicine 01/23/2024 with complaint of 2 months of persistent symptoms at least twice a week with nausea and vomiting for 2 hours and diarrhea.  Additionally complained of mild to moderate persistent epigastric pain with possibly some pain in the RUQ.  Stopped Wegovy few weeks prior but no improvement in symptoms.  Had similar symptoms late 2024 with normal LFTs and CBC.  Plan was for labs, CT abdomen/pelvis, and GI referral.   Labs 01/23/2024: Normal CBC, unremarkable HFP, normal BMP, normal lipase, normal sed rate   GI pathogen panel and C. difficile stool studies were ordered and have not yet been completed.   CT A/P 01/25/2024 showed no acute findings or explanation for patient's symptoms.  Last seen in office 03/19/2024 for complaint of intermittent nausea, vomiting, and diarrhea.  Symptoms were resolved at that time, endorsed possible association with eating fried foods.  CT and labs have been unremarkable.  No prior ultrasound of the gallbladder and endorsed family history of gallbladder disease.  Reported having an EGD at age 16 with finding of hiatal hernia and otherwise normal.  She was scheduled for EGD as well as RUQ ultrasound, with consideration for HIDA scan if symptoms persisted.  Also noted to be on [facility].  She was due for screening colonoscopy as well so this was scheduled in addition to EGD.  Underwent EGD/colonoscopy 04/19/2024.  No findings to explain symptoms on EGD.  On colonoscopy had 1 tubular adenoma and recall recommended in 7 years.  Also noted to have external and internal  hemorrhoids.  RUQ ultrasound was normal with no gallstones or other abnormalities of the gallbladder seen.  Discussed HIDA scan as next step, patient wanted to consider this.   Discussed the use of AI scribe software for clinical note transcription with the patient, who gave verbal consent to proceed.  History of Present Illness Kelly Clay is a 48 year old who presents with recurrent episodes of right upper quadrant abdominal pain, nausea, vomiting, and diarrhea.  Right upper quadrant abdominal pain - Recurrent episodes of severe right upper quadrant abdominal pain - Pain often triggered by consumption of fatty or greasy foods - Pain sometimes radiates to the center of the abdomen, but not significantly to the back - Described as severe and 'a little hot', not similar to heartburn or indigestion - Most recent episode occurred after a meal of steak, steamed rice, broccoli, and zucchini at a [employer] - Pain onset approximately nine hours after the meal, waking her at 5 AM - Pain persisted throughout the day and into the next - No associated fever, chills, or jaundice during episodes  Gastrointestinal symptoms - Episodes accompanied by nausea, vomiting, and diarrhea - Zofran  used for nausea management - Avoids anti-diarrheal medications to allow symptoms to resolve naturally - Fatty and greasy foods consistently exacerbate symptoms, though steak or hamburgers are sometimes tolerated - Avoids fried foods; grilled items from certain restaurants, such as [employer], cause significant pain  Rectal bleeding - Occasional rectal bleeding attributed to hemorrhoids - Rectal bleeding exacerbated by frequent diarrhea  Diagnostic evaluation - Right upper quadrant ultrasound, upper endoscopy, colonoscopy, and  CT scan performed - Liver enzymes have been normal  Family history - Family history of gallbladder disease   Previous GI Procedures/Imaging   RUQ US   04/29/2024 IMPRESSION: No suspicious focal liver lesion or cholelithiasis.  EGD 04/19/2024 - Z- line regular, 36 cm from the incisors.  - Normal esophagus.  - Normal stomach.  - Duodenal mucosal changes seen, rule out celiac disease. Biopsied. Path: 1. Surgical [P], duodenum :       - SMALL BOWEL MUCOSA WITH NO SPECIFIC PATHOLOGIC CHANGE.       - NEGATIVE FOR INTRAEPITHELIAL LYMPHOCYTOSIS OR VILLOUS BLUNTING.   Colonoscopy 04/19/2024 - One 5 mm polyp in the ascending colon, removed with a cold snare. Resected and retrieved.  - Normal mucosa in the entire examined colon. Biopsied.  - Non- bleeding external and internal hemorrhoids. - Recall 7 years Path:      2. Surgical [P], random colon sites :       - BENIGN COLONIC MUCOSA WITH NO SPECIFIC PATHOLOGIC CHANGE.        3. Surgical [P], colon, ascending, polyp (1) :       - TUBULAR ADENOMA.   CT A/P 01/25/2024 1. No acute findings or explanation for the patient's symptoms. 2. Thinly septated cyst or adjacent follicles within the right ovary measuring up to 4.1 cm in overall diameter. No concerning features or surrounding inflammatory changes. 3. Intrauterine device in place.   Past Medical History:  Diagnosis Date   Abnormal Pap smear    colpo   AMA (advanced maternal age) multigravida 35+    Anxiety    Complication of anesthesia    epidurals did not take   Depression    H/O hiatal hernia    Headache(784.0)    Hyperlipidemia    Nausea vomiting and diarrhea 05/02/2023   Preterm labor    induced at 36wks, PIH   Urinary tract infection     Past Surgical History:  Procedure Laterality Date   AUGMENTATION MAMMAPLASTY Bilateral    2023   CESAREAN SECTION  07/26/2012   Procedure: CESAREAN SECTION;  Surgeon: Krystal Deaner, MD;  Location: [facility];  Service: Obstetrics;  Laterality: N/A;  Primary Cesarean Section Delivery Baby Girl @ [phone], Apgars 9/9   DILATION AND CURETTAGE OF UTERUS     TONSILLECTOMY      Current  Outpatient Medications  Medication Sig Dispense Refill   ALPRAZolam (XANAX) 0.5 MG tablet Take 0.5 mg by mouth 2 (two) times daily as needed for anxiety.     amphetamine-dextroamphetamine (ADDERALL XR) 15 MG 24 hr capsule Take 15 mg by mouth every morning.     APLENZIN  522 MG TB24 Take 522 mg by mouth daily.     cloNIDine  (CATAPRES ) 0.1 MG tablet Take 0.1 mg by mouth at bedtime.     dicyclomine  (BENTYL ) 10 MG capsule Take 1 capsule (10 mg total) by mouth 3 (three) times daily as needed for spasms. 90 capsule 2   diphenoxylate -atropine  (LOMOTIL ) 2.5-0.025 MG tablet Take 2 tablets by mouth 4 (four) times daily as needed for diarrhea or loose stools. 60 tablet 0   ibuprofen  (ADVIL ,MOTRIN ) 200 MG tablet Take 400 mg by mouth daily as needed for mild pain.     lamoTRIgine (LAMICTAL PO) Take by mouth at bedtime.     levonorgestrel  (MIRENA ) 20 MCG/24HR IUD 1 each by Intrauterine route once.     nitroGLYCERIN  (NITROSTAT ) 0.4 MG SL tablet Place 1 tablet (0.4 mg total) under the tongue every 5 (five)  minutes as needed. 25 tablet 3   ondansetron  (ZOFRAN -ODT) 4 MG disintegrating tablet Take 1 tablet (4 mg total) by mouth every 8 (eight) hours as needed. 30 tablet 2   semaglutide-weight management (WEGOVY) 1.7 MG/0.75ML SOAJ SQ injection Inject 1.7 mg into the skin.     traZODone  (DESYREL ) 50 MG tablet Take 50-100 mg by mouth at bedtime as needed for sleep.     Vitamin D , Ergocalciferol , 50000 units CAPS Take 1 capsule by mouth once a week.     VYVANSE 30 MG capsule Take 30 mg by mouth daily.     No current facility-administered medications for this visit.    Allergies as of 06/11/2024 - Review Complete 06/11/2024  Allergen Reaction Noted   Sumatriptan Anaphylaxis, Swelling, and Other (See Comments) 02/11/2012   Terbutaline  Other (See Comments) 07/26/2012   Latex Itching and Rash 10/16/2018    Family History  Problem Relation Age of Onset   Hypertension Mother    COPD Mother    Heart disease Mother     Thyroid  disease Mother    Hypertension Father    Diabetes Father    Cancer Maternal Grandmother        breast   Breast cancer Maternal Grandmother    Birth defects Son        clubbed feet   Other Neg Hx    Colon cancer Neg Hx    Esophageal cancer Neg Hx    Rectal cancer Neg Hx    Stomach cancer Neg Hx    Pancreatic cancer Neg Hx     Social History   Tobacco Use   Smoking status: Never   Smokeless tobacco: Never  Vaping Use   Vaping status: Never Used  Substance Use Topics   Alcohol use: Yes    Comment: social   Drug use: No     Review of Systems:    Constitutional: No unintentional weight loss, fever, chills Cardiovascular: No chest pain Respiratory: No SOB Gastrointestinal: See HPI and otherwise negative   Physical Exam:  Vital signs: BP (!) 92/58   Pulse 90   Ht 5' 2 (1.575 m)   Wt 155 lb 8 oz (70.5 kg)   SpO2 96%   BMI 28.44 kg/m   Constitutional: Pleasant, well-appearing female in NAD, alert and cooperative Head:  Normocephalic and atraumatic.  Eyes: No scleral icterus.  Respiratory: Respirations even and unlabored. Lungs clear to auscultation bilaterally.  No wheezes, crackles, or rhonchi.  Cardiovascular:  Regular rate and rhythm. No murmurs. No peripheral edema. Gastrointestinal:  Soft, nondistended, tender to palpation of RUQ. No rebound or guarding. Normal bowel sounds. No appreciable masses or hepatomegaly. Rectal:  Not performed.  Neurologic:  Alert and oriented x4;  grossly normal neurologically.  Skin:   Dry and intact without significant lesions or rashes. Psychiatric: Oriented to person, place and time. Demonstrates good judgement and reason without abnormal affect or behaviors.   RELEVANT LABS AND IMAGING: CBC    Component Value Date/Time   WBC 7.0 01/23/2024 1559   RBC 4.44 01/23/2024 1559   HGB 13.6 01/23/2024 1559   HCT 40.0 01/23/2024 1559   PLT 305.0 01/23/2024 1559   MCV 90.1 01/23/2024 1559   MCH 30.6 04/25/2023 0020    MCHC 34.0 01/23/2024 1559   RDW 12.0 01/23/2024 1559   LYMPHSABS 2.3 01/23/2024 1559   MONOABS 0.4 01/23/2024 1559   EOSABS 0.3 01/23/2024 1559   BASOSABS 0.0 01/23/2024 1559    CMP     Component  Value Date/Time   NA 137 01/23/2024 1559   K 4.0 01/23/2024 1559   CL 106 01/23/2024 1559   CO2 29 01/23/2024 1559   GLUCOSE 97 01/23/2024 1559   BUN 9 01/23/2024 1559   CREATININE 0.74 01/23/2024 1559   CALCIUM  8.4 01/23/2024 1559   PROT 6.2 01/23/2024 1559   ALBUMIN 4.0 01/23/2024 1559   AST 13 01/23/2024 1559   ALT 4 01/23/2024 1559   ALKPHOS 36 (L) 01/23/2024 1559   BILITOT 0.5 01/23/2024 1559   GFRNONAA >60 04/26/2023 1002   GFRAA >60 10/16/2018 2115   Echocardiogram 07/10/2023 1. Left ventricular ejection fraction, by estimation, is 60 to 65% . The left ventricle has normal function. The left ventricle has no regional wall motion abnormalities. Left ventricular diastolic parameters were normal. 2. Right ventricular systolic function is normal. The right ventricular size is normal.  3. Left atrial size was mildly dilated.  4. The mitral valve is normal in structure. No evidence of mitral valve regurgitation. No evidence of mitral stenosis.  5. The aortic valve is normal in structure. Aortic valve regurgitation is not visualized. No aortic stenosis is present. 6. The inferior vena cava is normal in size with greater than 50% respiratory variability, suggesting right atrial pressure of 3 mmHg.  Assessment/Plan:   Assessment & Plan Intermittent RUQ abdominal pain Episodic nausea and vomiting Episodic diarrhea Patient continues to have episodes of intermittent right upper quadrant pain with nausea, vomiting, and diarrhea, exacerbated by fatty foods.  Symptoms are most suggestive of biliary colic, though she has had normal CT abdomen/pelvis as well as RUQ ultrasound.  Liver enzymes have been normal.  Nothing to explain symptoms on recent EGD or colonoscopy.  She does have family  history of gallbladder problems. She has tenderness to palpation of RUQ on exam today. We discussed options for further workup to include ordering a HIDA scan to evaluate for biliary dyskinesia, versus referral to surgery for further evaluation and management.  She would like to go ahead and have a surgical consult.  - Refer to surgery for evaluation and potential cholecystectomy. - Can follow up with us  as needed  Hemorrhoids Occasional minimal rectal bleeding likely due to hemorrhoids, exacerbated by diarrhea. No significant pain or bleeding currently.  - Advised over-the-counter treatments for hemorrhoid management as needed. - Instructed to report if symptoms worsen or significant bleeding occurs.    Camie Furbish, PA-C [facility] 06/11/2024, 11:15 AM  Patient Care Team: Rollene Almarie LABOR, MD as PCP - General (Internal Medicine) Elmira Newman PARAS, MD as PCP - Cardiology (Cardiology)

## 2024-06-13 DIAGNOSIS — K591 Functional diarrhea: Secondary | ICD-10-CM | POA: Diagnosis not present

## 2024-06-13 DIAGNOSIS — R112 Nausea with vomiting, unspecified: Secondary | ICD-10-CM | POA: Diagnosis not present

## 2024-06-19 DIAGNOSIS — F900 Attention-deficit hyperactivity disorder, predominantly inattentive type: Secondary | ICD-10-CM | POA: Diagnosis not present

## 2024-06-21 ENCOUNTER — Telehealth: Payer: Self-pay | Admitting: Gastroenterology

## 2024-06-21 NOTE — Telephone Encounter (Signed)
 Inbound call from patient stating that she was referred to get her gall bladder removed. The doctor who she was seeing to remove her gall bladder was trying to do a Hida scan but has not been able to get her in. Patient stated she is feeling very weak and pale and is having extreme abdominal pain. Patient is requesting a call back. Please advise.

## 2024-06-21 NOTE — Telephone Encounter (Signed)
 Touched base with patient.  She is sipping on diluted liquid IV  and took Motrin .  and is feeling less weak  with less pain.  She does have some low-key constant nausea. May I  send a rx for Zofran ? She will go to ED if her symptoms worsen again.  She sees the Surgeon on Tuesday, and hopefully by then will have authorization by insurance to proceed with HIDA scan.

## 2024-06-21 NOTE — Telephone Encounter (Signed)
 Spoke with patient who states she has felt unwell for the past several days and the pain she has been having has now referred to lower right back.  Patient feeling  weak and pale.  No fever noted.  Also had feeling of needing to have BM and can't.  Stated she did have normal BM yesterday.  Advised she should go to ED if she if feeling weak for possible IV fluids/pain medication. Patient states she saw Dr Ann with [facility] and is trying to get approved for HIDA scan with her insurance. Patient willing to go to ED but wants try some liquid IV and Motrin   to see if that helps. Advised I would call her back around noon.

## 2024-06-25 ENCOUNTER — Other Ambulatory Visit (HOSPITAL_COMMUNITY): Payer: Self-pay | Admitting: General Surgery

## 2024-06-25 DIAGNOSIS — R112 Nausea with vomiting, unspecified: Secondary | ICD-10-CM

## 2024-06-25 DIAGNOSIS — R1011 Right upper quadrant pain: Secondary | ICD-10-CM | POA: Diagnosis not present

## 2024-06-25 DIAGNOSIS — K591 Functional diarrhea: Secondary | ICD-10-CM | POA: Diagnosis not present

## 2024-06-27 ENCOUNTER — Encounter (HOSPITAL_COMMUNITY)
Admission: RE | Admit: 2024-06-27 | Discharge: 2024-06-27 | Disposition: A | Discharge status: Home / Self Care | Source: Ambulatory Visit | Attending: General Surgery | Admitting: General Surgery

## 2024-06-27 DIAGNOSIS — R112 Nausea with vomiting, unspecified: Secondary | ICD-10-CM | POA: Insufficient documentation

## 2024-06-27 DIAGNOSIS — K591 Functional diarrhea: Secondary | ICD-10-CM | POA: Insufficient documentation

## 2024-06-27 MED ORDER — TECHNETIUM TC 99M MEBROFENIN IV KIT
5.0000 | PACK | Freq: Once | INTRAVENOUS | Status: AC | PRN
  Administered 2024-06-27: 09:00:00 5.17 via INTRAVENOUS

## 2024-06-28 ENCOUNTER — Encounter (HOSPITAL_COMMUNITY): Admission: RE | Admit: 2024-06-28 | Source: Ambulatory Visit

## 2024-06-28 ENCOUNTER — Ambulatory Visit: Payer: Self-pay | Admitting: General Surgery

## 2024-06-28 ENCOUNTER — Other Ambulatory Visit: Payer: Self-pay

## 2024-06-28 ENCOUNTER — Encounter (HOSPITAL_COMMUNITY): Payer: Self-pay | Admitting: General Surgery

## 2024-06-28 DIAGNOSIS — K8050 Calculus of bile duct without cholangitis or cholecystitis without obstruction: Secondary | ICD-10-CM

## 2024-06-28 NOTE — Pre-Procedure Instructions (Signed)
 SDW CALL  Patient was given pre-op instructions over the phone. The opportunity was given for the patient to ask questions. No further questions asked. Patient verbalized understanding of instructions given.   PCP - Rollene Almarie LABOR, MD  Cardiologist - Elmira Newman PARAS, MD   PPM/ICD - denies   Chest x-ray - N/A EKG - N/A Stress Test - denies ECHO - 07/10/23 Cardiac Cath - denies  Sleep Study - n/a  Fasting Blood Sugar - n/A  Blood Thinner Instructions: N/A Aspirin  Instructions:N/A  ERAS Protcol - NPO order  COVID TEST- N/A   Anesthesia review: no  Patient denies shortness of breath, fever, cough and chest pain over the phone call    Surgical Instructions    Your procedure is scheduled on July 01, 2024  Report to [facility] Main Entrance A at 1:00 P.M., then check in with the Admitting office.  Call this number if you have problems the morning of surgery:  [phone]    Remember:  Do not eat or drink after midnight the night before your surgery   Take these medicines the morning of surgery with A SIP OF WATER:  Aplenzin , PRN: Xanax, Bentyl , Zofran    As of today, STOP taking any Aspirin  (unless otherwise instructed by your surgeon) Aleve , Naproxen , Ibuprofen , Motrin , Advil , Goody's, BC's, all herbal medications, fish oil, and all vitamins.  [facility] is not responsible for any belongings or valuables.   Contacts, glasses, hearing aids, dentures or partials may not be worn into surgery, please bring cases for these belongings   Patients discharged the day of surgery will not be allowed to drive home, and someone needs to stay with them for 24 hours.   SURGICAL WAITING ROOM VISITATION You may have 1-2 visitor in the pre-op area at a time determined by the pre-op nurse. (Visitor may not switch out) Patients having surgery or a procedure in a hospital may have two support people in the waiting room. Children under the age of 9 must have an  adult with them who is not the patient.  Please refer to the [facility] website for the visitor guidelines for Inpatients (after your surgery is over and you are in a regular room).    Day of Surgery:  Take a shower the day of or night before with antibacterial soap. Wear Clean/Comfortable clothing the morning of surgery Do not apply any deodorants/lotions.   Do not wear jewelry or makeup Do not wear lotions, powders, perfumes/colognes, or deodorant. Do not shave 48 hours prior to surgery.   Do not bring valuables to the hospital. Do not wear nail polish, gel polish, artificial nails, or any other type of covering on natural nails (fingers and toes)  Remember to brush your teeth WITH YOUR REGULAR TOOTHPASTE.

## 2024-07-01 ENCOUNTER — Ambulatory Visit (HOSPITAL_COMMUNITY): Admitting: Anesthesiology

## 2024-07-01 ENCOUNTER — Encounter (HOSPITAL_COMMUNITY): Payer: Self-pay | Admitting: General Surgery

## 2024-07-01 ENCOUNTER — Ambulatory Visit (HOSPITAL_COMMUNITY)
Admission: RE | Admit: 2024-07-01 | Discharge: 2024-07-01 | Disposition: A | Discharge status: Home / Self Care | Attending: General Surgery | Admitting: General Surgery

## 2024-07-01 ENCOUNTER — Other Ambulatory Visit: Payer: Self-pay

## 2024-07-01 DIAGNOSIS — K811 Chronic cholecystitis: Secondary | ICD-10-CM | POA: Insufficient documentation

## 2024-07-01 DIAGNOSIS — K828 Other specified diseases of gallbladder: Secondary | ICD-10-CM | POA: Diagnosis present

## 2024-07-01 HISTORY — PX: CHOLECYSTECTOMY: SHX55

## 2024-07-01 LAB — POCT I-STAT, CHEM 8
BUN: 12 mg/dL (ref 6–20)
Calcium, Ion: 1.21 mmol/L (ref 1.15–1.40)
Chloride: 105 mmol/L (ref 98–111)
Creatinine, Ser: 0.9 mg/dL (ref 0.44–1.00)
Glucose, Bld: 69 mg/dL — ABNORMAL LOW (ref 70–99)
HCT: 35 % — ABNORMAL LOW (ref 36.0–46.0)
Hemoglobin: 11.9 g/dL — ABNORMAL LOW (ref 12.0–15.0)
Potassium: 3.8 mmol/L (ref 3.5–5.1)
Sodium: 141 mmol/L (ref 135–145)
TCO2: 25 mmol/L (ref 22–32)

## 2024-07-01 LAB — POCT PREGNANCY, URINE: Preg Test, Ur: NEGATIVE

## 2024-07-01 SURGERY — LAPAROSCOPIC CHOLECYSTECTOMY
Anesthesia: General

## 2024-07-01 MED ORDER — PROPOFOL 10 MG/ML IV BOLUS
INTRAVENOUS | Status: DC | PRN
  Administered 2024-07-01: 200 mg via INTRAVENOUS

## 2024-07-01 MED ORDER — ROCURONIUM BROMIDE 10 MG/ML (PF) SYRINGE
PREFILLED_SYRINGE | INTRAVENOUS | Status: DC | PRN
  Administered 2024-07-01: 50 mg via INTRAVENOUS
  Administered 2024-07-01: 5 mg via INTRAVENOUS

## 2024-07-01 MED ORDER — CHLORHEXIDINE GLUCONATE CLOTH 2 % EX PADS: 6.0000 | MEDICATED_PAD | Freq: Once | CUTANEOUS | Status: DC

## 2024-07-01 MED ORDER — CELECOXIB 200 MG PO CAPS
ORAL_CAPSULE | ORAL | Status: AC
  Administered 2024-07-01: 200 mg via ORAL
  Filled 2024-07-01: qty 1

## 2024-07-01 MED ORDER — DEXAMETHASONE SOD PHOSPHATE PF 10 MG/ML IJ SOLN
INTRAMUSCULAR | Status: DC | PRN
  Administered 2024-07-01: 10 mg via INTRAVENOUS

## 2024-07-01 MED ORDER — MIDAZOLAM HCL (PF) 2 MG/2ML IJ SOLN
INTRAMUSCULAR | Status: DC | PRN
  Administered 2024-07-01: 2 mg via INTRAVENOUS

## 2024-07-01 MED ORDER — FENTANYL CITRATE (PF) 250 MCG/5ML IJ SOLN
INTRAMUSCULAR | Status: DC | PRN
  Administered 2024-07-01: 100 ug via INTRAVENOUS
  Administered 2024-07-01 (×3): 50 ug via INTRAVENOUS

## 2024-07-01 MED ORDER — GABAPENTIN 300 MG PO CAPS: 300.0000 mg | ORAL_CAPSULE | ORAL | Status: AC

## 2024-07-01 MED ORDER — MIDAZOLAM HCL 2 MG/2ML IJ SOLN
INTRAMUSCULAR | Status: AC
  Filled 2024-07-01: qty 2

## 2024-07-01 MED ORDER — CEFAZOLIN SODIUM-DEXTROSE 2-4 GM/100ML-% IV SOLN
INTRAVENOUS | Status: AC
  Filled 2024-07-01: qty 100

## 2024-07-01 MED ORDER — ENOXAPARIN SODIUM 40 MG/0.4ML IJ SOSY
40.0000 mg | PREFILLED_SYRINGE | Freq: Once | INTRAMUSCULAR | Status: AC
  Administered 2024-07-01: 40 mg via SUBCUTANEOUS
  Filled 2024-07-01: qty 0.4

## 2024-07-01 MED ORDER — OXYCODONE HCL 5 MG/5ML PO SOLN: 5.0000 mg | Freq: Once | ORAL | Status: DC | PRN

## 2024-07-01 MED ORDER — SUGAMMADEX SODIUM 200 MG/2ML IV SOLN
INTRAVENOUS | Status: DC | PRN
  Administered 2024-07-01: 150 mg via INTRAVENOUS

## 2024-07-01 MED ORDER — SCOPOLAMINE 1 MG/3DAYS TD PT72
1.0000 | MEDICATED_PATCH | TRANSDERMAL | Status: DC
  Administered 2024-07-01: 1 mg via TRANSDERMAL
  Filled 2024-07-01: qty 1

## 2024-07-01 MED ORDER — FENTANYL CITRATE (PF) 100 MCG/2ML IJ SOLN
INTRAMUSCULAR | Status: AC
  Filled 2024-07-01: qty 2

## 2024-07-01 MED ORDER — PROPOFOL 10 MG/ML IV BOLUS
INTRAVENOUS | Status: AC
  Filled 2024-07-01: qty 20

## 2024-07-01 MED ORDER — SUGAMMADEX SODIUM 200 MG/2ML IV SOLN
INTRAVENOUS | Status: AC
  Filled 2024-07-01: qty 2

## 2024-07-01 MED ORDER — OXYCODONE HCL 5 MG PO TABS: 5.0000 mg | ORAL_TABLET | ORAL | 0 refills | Status: AC | PRN

## 2024-07-01 MED ORDER — AMISULPRIDE (ANTIEMETIC) 5 MG/2ML IV SOLN
INTRAVENOUS | Status: AC
  Filled 2024-07-01: qty 4

## 2024-07-01 MED ORDER — FENTANYL CITRATE (PF) 250 MCG/5ML IJ SOLN
INTRAMUSCULAR | Status: AC
  Filled 2024-07-01: qty 5

## 2024-07-01 MED ORDER — ONDANSETRON HCL 4 MG/2ML IJ SOLN
INTRAMUSCULAR | Status: AC
  Filled 2024-07-01: qty 2

## 2024-07-01 MED ORDER — HYDROMORPHONE HCL 1 MG/ML IJ SOLN
INTRAMUSCULAR | Status: AC
  Filled 2024-07-01: qty 0.5

## 2024-07-01 MED ORDER — GABAPENTIN 300 MG PO CAPS
ORAL_CAPSULE | ORAL | Status: AC
  Administered 2024-07-01: 300 mg via ORAL
  Filled 2024-07-01: qty 1

## 2024-07-01 MED ORDER — DROPERIDOL 2.5 MG/ML IJ SOLN
0.6250 mg | Freq: Once | INTRAMUSCULAR | Status: AC
  Administered 2024-07-01: 0.625 mg via INTRAVENOUS

## 2024-07-01 MED ORDER — KETOROLAC TROMETHAMINE 30 MG/ML IJ SOLN
30.0000 mg | Freq: Once | INTRAMUSCULAR | Status: AC
  Administered 2024-07-01: 30 mg via INTRAVENOUS

## 2024-07-01 MED ORDER — ACETAMINOPHEN 500 MG PO TABS
1000.0000 mg | ORAL_TABLET | Freq: Once | ORAL | Status: DC
  Filled 2024-07-01: qty 2

## 2024-07-01 MED ORDER — ACETAMINOPHEN 500 MG PO TABS
1000.0000 mg | ORAL_TABLET | ORAL | Status: AC
  Administered 2024-07-01: 1000 mg via ORAL

## 2024-07-01 MED ORDER — ONDANSETRON HCL 4 MG/2ML IJ SOLN
INTRAMUSCULAR | Status: DC | PRN
  Administered 2024-07-01: 4 mg via INTRAVENOUS

## 2024-07-01 MED ORDER — LIDOCAINE 2% (20 MG/ML) 5 ML SYRINGE
INTRAMUSCULAR | Status: DC | PRN
  Administered 2024-07-01: 60 mg via INTRAVENOUS

## 2024-07-01 MED ORDER — KETOROLAC TROMETHAMINE 30 MG/ML IJ SOLN
INTRAMUSCULAR | Status: AC
  Filled 2024-07-01: qty 1

## 2024-07-01 MED ORDER — AMISULPRIDE (ANTIEMETIC) 5 MG/2ML IV SOLN
10.0000 mg | Freq: Once | INTRAVENOUS | Status: AC | PRN
  Administered 2024-07-01: 10 mg via INTRAVENOUS

## 2024-07-01 MED ORDER — BUPIVACAINE-EPINEPHRINE (PF) 0.25% -1:200000 IJ SOLN
INTRAMUSCULAR | Status: AC
  Filled 2024-07-01: qty 30

## 2024-07-01 MED ORDER — HYDROMORPHONE HCL 1 MG/ML IJ SOLN
INTRAMUSCULAR | Status: DC | PRN
  Administered 2024-07-01: .5 mg via INTRAVENOUS

## 2024-07-01 MED ORDER — DROPERIDOL 2.5 MG/ML IJ SOLN
INTRAMUSCULAR | Status: AC
  Filled 2024-07-01: qty 2

## 2024-07-01 MED ORDER — ROCURONIUM BROMIDE 10 MG/ML (PF) SYRINGE
PREFILLED_SYRINGE | INTRAVENOUS | Status: AC
  Filled 2024-07-01: qty 10

## 2024-07-01 MED ORDER — OXYCODONE HCL 5 MG PO TABS: 5.0000 mg | ORAL_TABLET | Freq: Once | ORAL | Status: DC | PRN

## 2024-07-01 MED ORDER — CHLORHEXIDINE GLUCONATE 0.12 % MT SOLN: 15.0000 mL | Freq: Once | OROMUCOSAL | Status: AC

## 2024-07-01 MED ORDER — LACTATED RINGERS IV SOLN: INTRAVENOUS | Status: DC

## 2024-07-01 MED ORDER — FENTANYL CITRATE (PF) 100 MCG/2ML IJ SOLN
25.0000 ug | INTRAMUSCULAR | Status: DC | PRN
  Administered 2024-07-01 (×3): 50 ug via INTRAVENOUS

## 2024-07-01 MED ORDER — CEFAZOLIN SODIUM-DEXTROSE 2-4 GM/100ML-% IV SOLN
2.0000 g | INTRAVENOUS | Status: AC
  Administered 2024-07-01: 2 g via INTRAVENOUS

## 2024-07-01 MED ORDER — ORAL CARE MOUTH RINSE: 15.0000 mL | Freq: Once | OROMUCOSAL | Status: AC

## 2024-07-01 MED ORDER — CHLORHEXIDINE GLUCONATE 0.12 % MT SOLN
OROMUCOSAL | Status: AC
  Administered 2024-07-01: 15 mL via OROMUCOSAL
  Filled 2024-07-01: qty 15

## 2024-07-01 MED ORDER — LIDOCAINE 2% (20 MG/ML) 5 ML SYRINGE
INTRAMUSCULAR | Status: AC
  Filled 2024-07-01: qty 5

## 2024-07-01 MED ORDER — BUPIVACAINE-EPINEPHRINE 0.25% -1:200000 IJ SOLN
INTRAMUSCULAR | Status: DC | PRN
  Administered 2024-07-01: 15 mL

## 2024-07-01 MED ORDER — INDOCYANINE GREEN 25 MG IJ SOLR
2.5000 mg | Freq: Once | INTRAMUSCULAR | Status: AC
  Administered 2024-07-01: 2.5 mg via INTRAVENOUS
  Filled 2024-07-01: qty 10

## 2024-07-01 MED ORDER — CELECOXIB 200 MG PO CAPS: 200.0000 mg | ORAL_CAPSULE | ORAL | Status: AC

## 2024-07-01 SURGICAL SUPPLY — 41 items
BAG COUNTER SPONGE SURGICOUNT (BAG) ×1 IMPLANT
BLADE CLIPPER SURG (BLADE) IMPLANT
CANISTER SUCTION 3000ML PPV (SUCTIONS) ×1 IMPLANT
CHLORAPREP W/TINT 26 (MISCELLANEOUS) ×1 IMPLANT
CLIP APPLIE ROT 10 11.4 M/L (STAPLE) IMPLANT
CLIP LIGATING HEMO LOK XL GOLD (MISCELLANEOUS) IMPLANT
CLIP LIGATING HEMO O LOK GREEN (MISCELLANEOUS) ×1 IMPLANT
CNTNR URN SCR LID CUP LEK RST (MISCELLANEOUS) ×1 IMPLANT
COVER SURGICAL LIGHT HANDLE (MISCELLANEOUS) ×1 IMPLANT
DERMABOND ADVANCED .7 DNX12 (GAUZE/BANDAGES/DRESSINGS) ×1 IMPLANT
ELECTRODE REM PT RTRN 9FT ADLT (ELECTROSURGICAL) ×1 IMPLANT
GLOVE BIOGEL PI MICRO STRL 6 (GLOVE) ×1 IMPLANT
GLOVE INDICATOR 6.5 STRL GRN (GLOVE) ×1 IMPLANT
GOWN STRL REUS W/ TWL LRG LVL3 (GOWN DISPOSABLE) ×3 IMPLANT
GRASPER SUT TROCAR 14GX15 (MISCELLANEOUS) ×1 IMPLANT
IRRIGATION SUCT STRKRFLW 2 WTP (MISCELLANEOUS) ×1 IMPLANT
KIT BASIN OR (CUSTOM PROCEDURE TRAY) ×1 IMPLANT
KIT IMAGING PINPOINTPAQ (MISCELLANEOUS) IMPLANT
KIT TURNOVER KIT B (KITS) ×1 IMPLANT
LHOOK LAP DISP 36CM (ELECTROSURGICAL) ×1 IMPLANT
NDL 22X1.5 STRL (OR ONLY) (MISCELLANEOUS) ×1 IMPLANT
NDL INSUFFLATION 14GA 120MM (NEEDLE) ×1 IMPLANT
NEEDLE 22X1.5 STRL (OR ONLY) (MISCELLANEOUS) ×1 IMPLANT
NEEDLE INSUFFLATION 14GA 120MM (NEEDLE) ×1 IMPLANT
PAD ARMBOARD POSITIONER FOAM (MISCELLANEOUS) ×1 IMPLANT
PENCIL BUTTON HOLSTER BLD 10FT (ELECTRODE) ×1 IMPLANT
POUCH LAPAROSCOPIC INSTRUMENT (MISCELLANEOUS) ×1 IMPLANT
SCISSORS LAP 5X35 DISP (ENDOMECHANICALS) ×1 IMPLANT
SET TUBE SMOKE EVAC HIGH FLOW (TUBING) ×1 IMPLANT
SLEEVE Z-THREAD 5X100MM (TROCAR) ×2 IMPLANT
SOLN 0.9% NACL POUR BTL 1000ML (IV SOLUTION) ×1 IMPLANT
SOLN STERILE WATER BTL 1000 ML (IV SOLUTION) ×1 IMPLANT
SUT MNCRL AB 4-0 PS2 18 (SUTURE) ×1 IMPLANT
SUT VICRYL 0 UR6 27IN ABS (SUTURE) IMPLANT
SYSTEM BAG RETRIEVAL 10MM (BASKET) ×1 IMPLANT
TOWEL GREEN STERILE (TOWEL DISPOSABLE) IMPLANT
TOWEL GREEN STERILE FF (TOWEL DISPOSABLE) ×1 IMPLANT
TRAY LAPAROSCOPIC MC (CUSTOM PROCEDURE TRAY) ×1 IMPLANT
TROCAR Z THREAD OPTICAL 12X100 (TROCAR) ×1 IMPLANT
TROCAR Z-THREAD OPTICAL 5X100M (TROCAR) ×1 IMPLANT
WARMER LAPAROSCOPE (MISCELLANEOUS) ×1 IMPLANT

## 2024-07-01 NOTE — H&P (Signed)
 "   HPI  Kelly Clay is an 48 y.o. female who was initially seen in clinic on 06/13/24 for RUQ pain, nausea and emesis.  Patient has had several episodes of nausea/vomiting and severe RUQ pain as well as diarrhea. She notices these symptoms with PO intake, primarily with fatty or greasy foods. She has been trying to avoid these foods as much as possible. Had been on Wegovy previously but these symptoms did not aid in improving her symptoms. When she has an episode it will last several hours but can last a day or two until it has completely resolved.   She has had normal LFTs, normal RUQ US , and unremarkable CT AP, no findings to explain symptoms.  A HIDA was ordered after initial consultation and resulted normal. Impression on radiology read noted no symptoms during imaging and after Ensure but called to discuss with patient and she states she did have RUQ pain, discomfort and nausea during this.   10 point review of systems is negative except as listed above in HPI.  Objective  Past Medical History: Past Medical History:  Diagnosis Date   Abnormal Pap smear    colpo   ADHD (attention deficit hyperactivity disorder)    AMA (advanced maternal age) multigravida 35+    Anxiety    Complication of anesthesia    epidurals did not take   Depression    H/O hiatal hernia    Headache(784.0)    Hyperlipidemia    Nausea vomiting and diarrhea 05/02/2023   Preterm labor    induced at 36wks, PIH   Urinary tract infection     Past Surgical History: Past Surgical History:  Procedure Laterality Date   AUGMENTATION MAMMAPLASTY Bilateral    2023   CESAREAN SECTION  07/26/2012   Procedure: CESAREAN SECTION;  Surgeon: Krystal Deaner, MD;  Location: [facility];  Service: Obstetrics;  Laterality: N/A;  Primary Cesarean Section Delivery Baby Girl @ [phone], Apgars 9/9   DILATION AND CURETTAGE OF UTERUS     EYE SURGERY Bilateral    eyelid surgery   TONSILLECTOMY      Family History:  Family  History  Problem Relation Age of Onset   Hypertension Mother    COPD Mother    Heart disease Mother    Thyroid  disease Mother    Hypertension Father    Diabetes Father    Cancer Maternal Grandmother        breast   Breast cancer Maternal Grandmother    Birth defects Son        clubbed feet   Other Neg Hx    Colon cancer Neg Hx    Esophageal cancer Neg Hx    Rectal cancer Neg Hx    Stomach cancer Neg Hx    Pancreatic cancer Neg Hx     Social History:  reports that she has never smoked. She has never used smokeless tobacco. She reports current alcohol use. She reports that she does not use drugs.  Allergies: Allergies[1]  Medications: I have reviewed the patient's current medications.  Labs: Pertinent lab work personally reviewed.  Imaging: Pertinent imaging personally reviewed  RUQ US  04/29/24:  No cholelithiasis, no wall thickening or pericholecystic fluid. Negative sonographic Murphy's CT AP 01/25/24: No gallstones, no gallbladder wall thickening or biliary dilatation.Small likely cysts in dome of the right and left lobes of liver stable from prior imaging. HIDA 06/27/24: Gallbladder EF 78% for fatty meal protocol. Activity visualized in small bowel and gallbladder, normal filling  of gallbladder. Normal excretion into biliary system.   Colonoscopy and EGD 04/19/24: decreased folds in duodenum with unremarkable biopsy results, tubular adenoma 5mm in ascending colon. Hemorrhoids   Physical Exam Blood pressure 120/71, pulse 82, temperature 98.2 F (36.8 C), temperature source Oral, resp. rate 18, height 5' 2 (1.575 m), weight 69.4 kg, SpO2 99%. General: No acute distress, well appearing HEENT: PERRL, hearing grossly normal, mucous membranes moist CV: Regular rate and rhythm Pulm: Normal work of breathing on room air Abd: Soft, tender to palpation in RUQ, nondistended. Extremities: Warm and well perfused Neuro: A&O x4, no focal neurologic deficits Psych: Appropriate mood  and effect    Assessment   Kelly Clay is an 48 y.o. female with RUQ pain, nausea and emesis for several months.  Plan  - We discussed the etiology of patient's pain, we discussed treatment options. I discussed with patient that all of her workup has been normal but her symptoms do sound consistent with biliary etiology. I emphasized that I cannot guarantee cholecystectomy will improve her symptoms given her normal and extensive workup, however I think there is a possibility it may improve symptoms, and at least can offer diagnostic value through eliminating the gallbladder as a source of her symptoms if her pain continues after removal. Patient voiced understanding and stated she would prefer to have cholecystectomy to see if it helps symptoms, understanding that given her normal workup it very well may not resolve any of them and she is willing to accept this risk. - We discussed details of surgery including general anesthesia, laparoscopic approach, identification of cystic duct and common bile duct. Ligation of cystic duct and cystic artery. Possible need for intraoperative cholangiogram, open procedure, and subtotal cholecystectomy. Possible risks of common bile duct injury, injury to surrounding structures, bile leak, bleeding, infection, diarrhea, retained stone and hernia. The patient showed good understanding and all questions were answered  - Will proceed with laparoscopic cholecystectomy with ICG   Orie Silversmith, MD General Surgery, Surgical Critical Care and Trauma       [1]  Allergies Allergen Reactions   Sumatriptan Anaphylaxis, Swelling and Other (See Comments)    In throat. Numbness/tingling in body Other reaction(s): Respiratory Distress   Terbutaline  Other (See Comments)    hypotension   Latex Itching and Rash   "

## 2024-07-01 NOTE — Anesthesia Procedure Notes (Signed)
 Procedure Name: Intubation Date/Time: 07/01/2024 4:00 PM  Performed by: Julien Manus, CRNAPre-anesthesia Checklist: Patient identified, Emergency Drugs available, Suction available and Patient being monitored Patient Re-evaluated:Patient Re-evaluated prior to induction Oxygen Delivery Method: Circle System Utilized Preoxygenation: Pre-oxygenation with 100% oxygen Induction Type: IV induction Ventilation: Mask ventilation without difficulty Laryngoscope Size: Mac and 3 Grade View: Grade I Tube type: Oral Tube size: 7.0 mm Number of attempts: 1 Airway Equipment and Method: Stylet and Oral airway Placement Confirmation: ETT inserted through vocal cords under direct vision, positive ETCO2 and breath sounds checked- equal and bilateral Secured at: 22 cm Tube secured with: Tape Dental Injury: Teeth and Oropharynx as per pre-operative assessment

## 2024-07-01 NOTE — Op Note (Signed)
 07/01/2024 3:22 PM  PATIENT: Kelly Clay  48 y.o. female  Patient Care Team: Rollene Almarie LABOR, MD as PCP - General (Internal Medicine) Elmira Newman PARAS, MD as PCP - Cardiology (Cardiology)  PRE-OPERATIVE DIAGNOSIS: biliary dyskinesia  POST-OPERATIVE DIAGNOSIS: biliary dyskinesia with chronic cholecystitis  PROCEDURE: laparoscopic cholecystectomy with indocyanine green   SURGEON: Orie Silversmith, MD  ASSISTANT: None  ANESTHESIA: General endotracheal  EBL: 5cc  DRAINS: None  SPECIMEN: Gallbladder  COUNTS: Sponge, needle and instrument counts were reported correct x2 at the conclusion of the operation  DISPOSITION: PACU in satisfactory condition  COMPLICATIONS: None  FINDINGS: Critical view of safety achieved prior to clipping duct and artery. Posterior branch of cystic artery present and clipped. ICG confirmed biliary anatomy and cystic duct, common bile duct, common hepatic duct all seen with fluorescence prior to any clip and ligation. Gallbladder appeared chronically inflamed.  DESCRIPTION:  Preoperative indocyanine green  was administered in preoperative holding. The patient was identified & brought into the operating room. She was then positioned supine on the OR table. SCDs were in place and active during the entire case. She then underwent general endotracheal anesthesia. Pressure points were padded. Hair on the abdomen was clipped by the OR team. The abdomen was prepped and draped in the standard sterile fashion. Antibiotics were administered. A surgical timeout was performed and confirmed our plan.  A small incision was made in the LUQ at Palmer's point and a veress needle was inserted. Air was aspirated and subsequent positive drop test. Abdomen then insufflated to . A periumbilical incision was then made and a 5mm trocar optiview using a 30 degree scope was inserted and the abdomen was entered under direct visualization. Inspection confirmed no  evidence of trocar or veress site complications. The veress was then removed.   The patient was then positioned in reverse Trendelenburg with slight left side down. A 12 mm supxiphoid trocar was placed under direct visualization and  two additional 5mm trocars were placed along the right subcostal line - one 5mm port in mid subcostal region, another 5mm port in the right flank near the anterior axillary line.  The liver and gallbladder were inspected. Gallbladder appeared chronically inflamed. The gallbladder fundus was grasped and elevated cephalad. An additional grasper was then placed on the infundibulum of the gallbladder and the infundibulum was retracted laterally. Staying high on the gallbladder, the peritoneum on both sides of the gallbladder was opened with hook cautery. Gentle blunt dissection was then employed with a [place]  dissector working down into [employer]. The cystic duct was identified and carefully circumferentially dissected. The cystic artery was also identified and carefully circumferentially dissected. The space between the cystic artery and hepatocystic plate was developed such that a good view of the liver could be seen through a window medial to the cystic artery. The triangle of Calot had been cleared of all fibrofatty tissue. At this point, a critical view of safety was achieved and the only structures visualized was the skeletonized cystic duct laterally, the skeletonized cystic artery and the liver through the window medial to the artery. A posterior cystic artery was noted.  Attention turned to infrared fluorescent cholangiography with indocyanine green  which was visualized within the common hepatic duct, common bile duct, cystic duct and small bowel.   The cystic duct and artery and posterior artery were clipped with 2 hemolock clips on the patient side and 1 clip on the specimen side. The cystic duct and artery were then divided. The gallbladder was then  freed from  its remaining attachments to the liver using electrocautery. There was inadvertent violation of gallbladder wall and it was quite intrahepatic during dissection. Gallbladder then placed into an endocatch bag. The RUQ was gently irrigated with sterile saline. Hemostasis was then verified. The clips were in good position; the gallbladder fossa was dry. The rest of the abdomen was inspected no injury nor bleeding elsewhere was identified.  The endocatch bag containing the gallbladder was then removed from the subxiphoid port site and passed off as specimen. The subxiphoid port fascia was then closed in a figured of eight fashion with 0 vicryl using a suture passer. The RUQ ports were removed under direct visualization and noted to be hemostatic.[name] The fascia was palpated and noted to be completely closed. The abdomen was then desufflated and the periumbilical trocar removed. The skin of all incision sites was approximated with 4-0 monocryl subcuticular suture and dermabond applied. She was then awakened from anesthesia, extubated, and transferred to a stretcher for transport to PACU in satisfactory condition.  Instrument, sponge, and needle counts were correct at closure and at the conclusion of the case.   Orie Silversmith, MD [facility] Surgery

## 2024-07-01 NOTE — Transfer of Care (Signed)
 Immediate Anesthesia Transfer of Care Note  Patient: Kelly Clay  Procedure(s) Performed: LAPAROSCOPIC CHOLECYSTECTOMY  Patient Location: PACU  Anesthesia Type:General  Level of Consciousness: awake and alert   Airway & Oxygen Therapy: Patient Spontanous Breathing and Patient connected to face mask oxygen  Post-op Assessment: Report given to RN and Post -op Vital signs reviewed and stable  Post vital signs: Reviewed and stable  Last Vitals:  Vitals Value Taken Time  BP 132/88 07/01/24 17:23  Temp    Pulse 84 07/01/24 17:26  Resp 18 07/01/24 17:26  SpO2 100 % 07/01/24 17:26  Vitals shown include unfiled device data.  Last Pain:  Vitals:   07/01/24 1405  TempSrc:   PainSc: 0-No pain         Complications: No notable events documented.

## 2024-07-01 NOTE — H&P (Incomplete)
 "   HPI  Kelly Clay is an 48 y.o. female who was initially seen in clinic on 06/13/24 for RUQ pain, nausea and emesis.  Patient has had several episodes of nausea/vomiting and severe RUQ pain as well as diarrhea. She notices these symptoms with PO intake, primarily with fatty or greasy foods. She has been trying to avoid these foods as much as possible. Had been on Wegovy previously but these symptoms did not aid in improving her symptoms. When she has an episode it will last several hours but can last a day or two until it has completely resolved.   She has had normal LFTs, normal RUQ US , and unremarkable CT AP, no findings to explain symptoms.  A HIDA was ordered after initial consultation and resulted normal. Impression on radiology read noted no symptoms during imaging and after Ensure but called to discuss with patient and she states she did have RUQ pain, discomfort and nausea during this.   10 point review of systems is negative except as listed above in HPI.  Objective  Past Medical History: Past Medical History:  Diagnosis Date   Abnormal Pap smear    colpo   ADHD (attention deficit hyperactivity disorder)    AMA (advanced maternal age) multigravida 35+    Anxiety    Complication of anesthesia    epidurals did not take   Depression    H/O hiatal hernia    Headache(784.0)    Hyperlipidemia    Nausea vomiting and diarrhea 05/02/2023   Preterm labor    induced at 36wks, PIH   Urinary tract infection     Past Surgical History: Past Surgical History:  Procedure Laterality Date   AUGMENTATION MAMMAPLASTY Bilateral    2023   CESAREAN SECTION  07/26/2012   Procedure: CESAREAN SECTION;  Surgeon: Krystal Deaner, MD;  Location: [facility];  Service: Obstetrics;  Laterality: N/A;  Primary Cesarean Section Delivery Baby Girl @ [phone], Apgars 9/9   DILATION AND CURETTAGE OF UTERUS     EYE SURGERY Bilateral    eyelid surgery   TONSILLECTOMY      Family History:  Family  History  Problem Relation Age of Onset   Hypertension Mother    COPD Mother    Heart disease Mother    Thyroid  disease Mother    Hypertension Father    Diabetes Father    Cancer Maternal Grandmother        breast   Breast cancer Maternal Grandmother    Birth defects Son        clubbed feet   Other Neg Hx    Colon cancer Neg Hx    Esophageal cancer Neg Hx    Rectal cancer Neg Hx    Stomach cancer Neg Hx    Pancreatic cancer Neg Hx     Social History:  reports that she has never smoked. She has never used smokeless tobacco. She reports current alcohol use. She reports that she does not use drugs.  Allergies: Allergies[1]  Medications: I have reviewed the patient's current medications.  Labs: Pertinent lab work personally reviewed.  Imaging: Pertinent imaging personally reviewed  RUQ US  04/29/24:  No cholelithiasis, no wall thickening or pericholecystic fluid. Negative sonographic Murphy's CT AP 01/25/24: No gallstones, no gallbladder wall thickening or biliary dilatation.Small likely cysts in dome of the right and left lobes of liver stable from prior imaging. HIDA 06/27/24: Gallbladder EF 78% for fatty meal protocol. Activity visualized in small bowel and gallbladder, normal filling  of gallbladder. Normal excretion into biliary system.   Colonoscopy and EGD 04/19/24: decreased folds in duodenum with unremarkable biopsy results, tubular adenoma 5mm in ascending colon. Hemorrhoids   Physical Exam There were no vitals taken for this visit. General: No acute distress, well appearing HEENT: PERRL, hearing grossly normal, mucous membranes moist CV: Regular rate and rhythm Pulm: Normal work of breathing on room air Abd: Soft, tender to palpation in RUQ, nondistended. Extremities: Warm and well perfused Neuro: A&O x4, no focal neurologic deficits Psych: Appropriate mood and effect    Assessment   Kelly Clay is an 48 y.o. female with RUQ pain, nausea and emesis for  several months.  Plan  - We discussed the etiology of patient's pain, we discussed treatment options. I discussed with patient that all of her workup has been normal but her symptoms do sound consistent with biliary etiology. I emphasized that I cannot guarantee cholecystectomy will improve her symptoms given her normal and extensive workup, however I think there is a possibility it may improve symptoms, and at least can offer diagnostic value through eliminating the gallbladder as a source of her symptoms if her pain continues after removal. Patient voiced understanding and stated she would prefer to have cholecystectomy to see if it helps symptoms, understanding that given her normal workup it very well may not resolve any of them and she is willing to accept this risk. - We discussed details of surgery including general anesthesia, laparoscopic approach, identification of cystic duct and common bile duct. Ligation of cystic duct and cystic artery. Possible need for intraoperative cholangiogram, open procedure, and subtotal cholecystectomy. Possible risks of common bile duct injury, injury to surrounding structures, bile leak, bleeding, infection, diarrhea, retained stone and hernia. The patient showed good understanding and all questions were answered  - Will proceed with laparoscopic cholecystectomy with ICG   Orie Silversmith, MD General Surgery, Surgical Critical Care and Trauma      [1]  Allergies Allergen Reactions   Sumatriptan Anaphylaxis, Swelling and Other (See Comments)    In throat. Numbness/tingling in body Other reaction(s): Respiratory Distress   Terbutaline  Other (See Comments)    hypotension   Latex Itching and Rash   "

## 2024-07-01 NOTE — Anesthesia Preprocedure Evaluation (Addendum)
"                                    Anesthesia Evaluation  Patient identified by MRN, date of birth, ID band Patient awake    Reviewed: Allergy & Precautions, NPO status , Patient's Chart, lab work & pertinent test results  Airway Mallampati: II  TM Distance: >3 FB Neck ROM: Full    Dental no notable dental hx.    Pulmonary neg pulmonary ROS   Pulmonary exam normal        Cardiovascular negative cardio ROS Normal cardiovascular exam     Neuro/Psych  Headaches PSYCHIATRIC DISORDERS Anxiety Depression       GI/Hepatic Neg liver ROS, hiatal hernia,,,  Endo/Other  negative endocrine ROS    Renal/GU negative Renal ROS     Musculoskeletal negative musculoskeletal ROS (+)    Abdominal   Peds  Hematology negative hematology ROS (+)   Anesthesia Other Findings NAUSEA AND VOMITING  RIGHT UPPER QUADRANT PAIN FUNCTIONAL DIARRHEA    Reproductive/Obstetrics Hcg negative                              Anesthesia Physical Anesthesia Plan  ASA: 2  Anesthesia Plan: General   Post-op Pain Management:    Induction: Intravenous  PONV Risk Score and Plan: 4 or greater and Ondansetron , Dexamethasone , Midazolam , Scopolamine  patch - Pre-op and Treatment may vary due to age or medical condition  Airway Management Planned: Oral ETT  Additional Equipment:   Intra-op Plan:   Post-operative Plan: Extubation in OR  Informed Consent: I have reviewed the patients History and Physical, chart, labs and discussed the procedure including the risks, benefits and alternatives for the proposed anesthesia with the patient or authorized representative who has indicated his/her understanding and acceptance.     Dental advisory given  Plan Discussed with: CRNA  Anesthesia Plan Comments:          Anesthesia Quick Evaluation  "

## 2024-07-02 ENCOUNTER — Encounter (HOSPITAL_COMMUNITY): Payer: Self-pay | Admitting: General Surgery

## 2024-07-02 NOTE — Anesthesia Postprocedure Evaluation (Signed)
"   Anesthesia Post Note  Patient: Kelly Clay  Procedure(s) Performed: LAPAROSCOPIC CHOLECYSTECTOMY     Patient location during evaluation: PACU Anesthesia Type: General Level of consciousness: awake Pain management: pain level controlled Vital Signs Assessment: post-procedure vital signs reviewed and stable Respiratory status: spontaneous breathing, nonlabored ventilation and respiratory function stable Cardiovascular status: blood pressure returned to baseline and stable Postop Assessment: no apparent nausea or vomiting Anesthetic complications: no   No notable events documented.  Last Vitals:  Vitals:   07/01/24 1815 07/01/24 1830  BP: (!) 140/82 139/80  Pulse: 73 74  Resp: 16 15  Temp:  36.9 C  SpO2: 97% 100%    Last Pain:  Vitals:   07/01/24 1830  TempSrc:   PainSc: Asleep                 Lacrystal Barbe P [place]      "

## 2024-07-03 LAB — SURGICAL PATHOLOGY
